# Patient Record
Sex: Male | Born: 1950 | Race: White | Hispanic: No | Smoking: Former smoker
Health system: Southern US, Community
[De-identification: ages and names within clinical notes are randomized; demographics above are authoritative.]

## PROBLEM LIST (undated history)

## (undated) DIAGNOSIS — D179 Benign lipomatous neoplasm, unspecified: Secondary | ICD-10-CM

## (undated) DIAGNOSIS — J189 Pneumonia, unspecified organism: Secondary | ICD-10-CM

## (undated) DIAGNOSIS — E669 Obesity, unspecified: Secondary | ICD-10-CM

## (undated) DIAGNOSIS — K219 Gastro-esophageal reflux disease without esophagitis: Secondary | ICD-10-CM

## (undated) DIAGNOSIS — E7800 Pure hypercholesterolemia, unspecified: Secondary | ICD-10-CM

## (undated) DIAGNOSIS — Z9889 Other specified postprocedural states: Secondary | ICD-10-CM

## (undated) DIAGNOSIS — R112 Nausea with vomiting, unspecified: Secondary | ICD-10-CM

## (undated) DIAGNOSIS — E559 Vitamin D deficiency, unspecified: Secondary | ICD-10-CM

## (undated) DIAGNOSIS — J449 Chronic obstructive pulmonary disease, unspecified: Secondary | ICD-10-CM

## (undated) DIAGNOSIS — E119 Type 2 diabetes mellitus without complications: Secondary | ICD-10-CM

## (undated) DIAGNOSIS — B1920 Unspecified viral hepatitis C without hepatic coma: Secondary | ICD-10-CM

## (undated) DIAGNOSIS — G4700 Insomnia, unspecified: Secondary | ICD-10-CM

## (undated) DIAGNOSIS — G709 Myoneural disorder, unspecified: Secondary | ICD-10-CM

## (undated) DIAGNOSIS — B182 Chronic viral hepatitis C: Secondary | ICD-10-CM

## (undated) DIAGNOSIS — G4730 Sleep apnea, unspecified: Secondary | ICD-10-CM

## (undated) DIAGNOSIS — M1990 Unspecified osteoarthritis, unspecified site: Secondary | ICD-10-CM

## (undated) HISTORY — DX: Chronic viral hepatitis C: B18.2

## (undated) HISTORY — DX: Pure hypercholesterolemia, unspecified: E78.00

## (undated) HISTORY — DX: Type 2 diabetes mellitus without complications: E11.9

## (undated) HISTORY — DX: Vitamin D deficiency, unspecified: E55.9

## (undated) HISTORY — DX: Gastro-esophageal reflux disease without esophagitis: K21.9

## (undated) HISTORY — DX: Benign lipomatous neoplasm, unspecified: D17.9

## (undated) HISTORY — DX: Insomnia, unspecified: G47.00

## (undated) HISTORY — PX: SHOULDER SURGERY: SHX246

## (undated) HISTORY — PX: EYE SURGERY: SHX253

## (undated) HISTORY — DX: Chronic obstructive pulmonary disease, unspecified: J44.9

## (undated) HISTORY — DX: Obesity, unspecified: E66.9

---
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
## 1987-10-24 HISTORY — PX: BACK SURGERY: SHX140

## 2001-08-14 ENCOUNTER — Ambulatory Visit (HOSPITAL_COMMUNITY): Admission: RE | Admit: 2001-08-14 | Discharge: 2001-08-14 | Payer: Self-pay | Admitting: Gastroenterology

## 2001-08-14 ENCOUNTER — Encounter (INDEPENDENT_AMBULATORY_CARE_PROVIDER_SITE_OTHER): Payer: Self-pay

## 2003-08-01 ENCOUNTER — Ambulatory Visit (HOSPITAL_BASED_OUTPATIENT_CLINIC_OR_DEPARTMENT_OTHER): Admission: RE | Admit: 2003-08-01 | Discharge: 2003-08-01 | Payer: Self-pay | Admitting: Internal Medicine

## 2004-10-28 ENCOUNTER — Emergency Department (HOSPITAL_COMMUNITY): Admission: EM | Admit: 2004-10-28 | Discharge: 2004-10-28 | Payer: Self-pay | Admitting: *Deleted

## 2004-10-28 ENCOUNTER — Ambulatory Visit: Payer: Self-pay | Admitting: Family Medicine

## 2004-12-26 ENCOUNTER — Ambulatory Visit: Payer: Self-pay | Admitting: Internal Medicine

## 2005-01-02 ENCOUNTER — Ambulatory Visit: Payer: Self-pay | Admitting: Internal Medicine

## 2005-04-28 ENCOUNTER — Ambulatory Visit: Payer: Self-pay | Admitting: Internal Medicine

## 2005-07-03 ENCOUNTER — Ambulatory Visit: Payer: Self-pay | Admitting: Internal Medicine

## 2005-09-27 ENCOUNTER — Ambulatory Visit (HOSPITAL_COMMUNITY): Admission: RE | Admit: 2005-09-27 | Discharge: 2005-09-27 | Payer: Self-pay | Admitting: Otolaryngology

## 2005-09-27 ENCOUNTER — Encounter (INDEPENDENT_AMBULATORY_CARE_PROVIDER_SITE_OTHER): Payer: Self-pay | Admitting: *Deleted

## 2006-01-01 ENCOUNTER — Ambulatory Visit: Payer: Self-pay | Admitting: Internal Medicine

## 2006-07-09 ENCOUNTER — Ambulatory Visit: Payer: Self-pay | Admitting: Internal Medicine

## 2006-10-08 ENCOUNTER — Ambulatory Visit: Payer: Self-pay | Admitting: Internal Medicine

## 2006-12-06 ENCOUNTER — Encounter: Admission: RE | Admit: 2006-12-06 | Discharge: 2006-12-06 | Payer: Self-pay | Admitting: Family Medicine

## 2006-12-18 ENCOUNTER — Ambulatory Visit (HOSPITAL_COMMUNITY): Admission: RE | Admit: 2006-12-18 | Discharge: 2006-12-18 | Payer: Self-pay | Admitting: Family Medicine

## 2007-03-15 ENCOUNTER — Encounter: Admission: RE | Admit: 2007-03-15 | Discharge: 2007-03-15 | Payer: Self-pay | Admitting: Family Medicine

## 2011-03-10 NOTE — Op Note (Signed)
NAMECHATHAM, HOWINGTON                ACCOUNT NO.:  [id number]   MEDICAL RECORD NO.:  [id number]          PATIENT TYPE:  AMB   LOCATION:  SDS                          FACILITY:  [facility]   PHYSICIAN:  Jefry H. Pollyann Kennedy, MD     DATE OF BIRTH:  23-Aug-1951   DATE OF PROCEDURE:  09/27/2005  DATE OF DISCHARGE:                                 OPERATIVE REPORT   PREOPERATIVE DIAGNOSIS:  Left vocal cord mass with hoarseness.   POSTOPERATIVE DIAGNOSIS:  Left vocal cord mass with hoarseness.   PROCEDURE:  Microlaryngoscopy with laser excision of left vocal cord mass.   SURGEON:  Jefry H. Pollyann Kennedy, M.D.   ANESTHESIA:  General endotracheal anesthesia.   COMPLICATIONS:  Minimal.   FINDINGS:  Thickened and hypertrophic mass involving the left membranous  vocal fold from just below the contacting surface to up in the lateral  aspect of the floor of the ventricle starting just anterior to the focal  process and ending just posterior to the anterior commissure.  The right  side was unremarkable except for some edema related to trauma from  contacting the left-sided lesion.   REFERRING PHYSICIAN:  Gordy Savers, M.D.   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   HISTORY:  This is a 60 year old gentleman with a multi-month history of  hoarseness.  He was found on office examination to have a mass involving the  left vocal cord.  Risks, benefits, alternatives and complications of the  procedure were explained to the patient who seemed to understand and agreed  to surgery.   DESCRIPTION OF PROCEDURE:  Patient was taken to the operating room and  placed on the operating table in supine position.  Following induction of  general endotracheal anesthesia, table was turned and the patient was draped  in standard fashion.  A Jaco laryngoscope was used view the larynx.  It was  attached to the Mayo stand with suspension apparatus.  The  microscope was  brought into view.  The lesion was inspected thoroughly.  Xylocaine with  epinephrine was infiltrated with laryngeal syringe into the left membranous  field.  The CO2 laser was attached to the microscope and used with a focused  point at 2 watts continuous power and this was used to dissect off the  posterior approximately one third of the lesion with a 1 to 2 mm cuff of  normal mucosa surrounding it posteriorly.  This was sent for frozen section  evaluation.  The remainder of the lesion was completely ablated using the  CO2 laser at 2 and 3 watts superpulse mode.  The focal ligament was kept  intact.  The lesion was removed entirely.  Topical  adrenalin was used to provide completion of hemostasis.  The laser  endotracheal tube was replaced with a standard #8 cuffed endotracheal tube.  Patient was then awakened, extubated and transferred to the recovery room in  stable condition.      Jefry H. Pollyann Kennedy, MD  Electronically Signed     JHR/MEDQ  D:  09/27/2005  T:  09/27/2005  Job:  [id number]   cc:   Gordy Savers, M.D. [facility]  [address] [place]  [place] [address]

## 2014-08-03 ENCOUNTER — Other Ambulatory Visit: Payer: Self-pay | Admitting: Gastroenterology

## 2014-08-03 DIAGNOSIS — C220 Liver cell carcinoma: Secondary | ICD-10-CM

## 2014-08-10 ENCOUNTER — Ambulatory Visit
Admission: RE | Admit: 2014-08-10 | Discharge: 2014-08-10 | Disposition: A | Discharge status: Home / Self Care | Payer: Medicare Other | Source: Ambulatory Visit | Attending: Gastroenterology | Admitting: Gastroenterology

## 2014-08-10 DIAGNOSIS — C220 Liver cell carcinoma: Secondary | ICD-10-CM

## 2014-09-21 ENCOUNTER — Encounter: Payer: Self-pay | Admitting: Pulmonary Disease

## 2014-09-22 ENCOUNTER — Encounter: Payer: Self-pay | Admitting: Pulmonary Disease

## 2014-09-22 ENCOUNTER — Ambulatory Visit (INDEPENDENT_AMBULATORY_CARE_PROVIDER_SITE_OTHER): Payer: Medicare Other | Admitting: Pulmonary Disease

## 2014-09-22 VITALS — BP 118/64 | HR 91 | Ht 70.0 in | Wt 310.0 lb

## 2014-09-22 DIAGNOSIS — J441 Chronic obstructive pulmonary disease with (acute) exacerbation: Secondary | ICD-10-CM

## 2014-09-22 DIAGNOSIS — R938 Abnormal findings on diagnostic imaging of other specified body structures: Secondary | ICD-10-CM

## 2014-09-22 DIAGNOSIS — R0600 Dyspnea, unspecified: Secondary | ICD-10-CM | POA: Insufficient documentation

## 2014-09-22 DIAGNOSIS — J449 Chronic obstructive pulmonary disease, unspecified: Secondary | ICD-10-CM

## 2014-09-22 DIAGNOSIS — R9389 Abnormal findings on diagnostic imaging of other specified body structures: Secondary | ICD-10-CM

## 2014-09-22 NOTE — Assessment & Plan Note (Signed)
COPD: GOLD Grade B, Grade 2 airflow obstruction Combined recommendations from the [place], [employer], Investment banker, corporate, [place] (Qaseem A et al, Ann Intern Med. 2011;155(3):179) recommends tobacco cessation, pulmonary rehab (for symptomatic patients with an FEV1 < 50% predicted), supplemental oxygen (for patients with SaO2 <88% or paO2 <55), and appropriate bronchodilator therapy.  In regards to long acting bronchodilators, they recommend monotherapy (FEV1 60-80% with symptoms weak evidence, FEV1 with symptoms <60% strong evidence), or combination therapy (FEV1 <60% with symptoms, strong recommendation, moderate evidence).  One should also provide patients with annual immunizations and consider therapy for prevention of COPD exacerbations (ie. roflumilast or azithromycin) when appopriate.  -O2 therapy: Not indicated -Immunizations: He will check with his PCP about the status of his Prevnar vaccination -Tobacco use: Quit age 74 -Exercise: Encouraged regular exercise after stress test -Bronchodilator therapy: Continue duoneb 1-2 times per day, albuterol as needed; at this point since he is doing well I don't think he needs Spiriva, but if he has worsening symptoms we could add this -Exacerbation prevention: Only one recent exacerbation, so will observe for now, may need Spiriva as detailed above if recurrent exacerbation or worsening symptoms.

## 2014-09-22 NOTE — Assessment & Plan Note (Signed)
This symptom is primarily related to COPD as it has dramatically improved with the addition of bronchodilators. However, considering his obesity and lengthy smoking history and family history of coronary artery disease I completely agree with his primary care physician's plan to obtain a cardiac stress test. If that is normal then I have advised that he start a regular exercise routine for weight loss purposes. We will try to track down the result of the stress test once it has been performed.

## 2014-09-22 NOTE — Progress Notes (Signed)
Subjective:    Patient ID: Thomas Wells, male    DOB: 1951-07-12, 63 y.o.   MRN: [id number]  HPI   Mr. Murdoch was referrd by his PCP Dr. Lisbeth Ply for a new diagnosis of COPD.  He had been recently hospitalized for shortness of breath and was diagnosed with COPD and discharged home on prednisone and albuterol.  He went to the hospital for leg swelling and pain and ended up in the hospital for COPD.  He had a CT scan which ruled out PE and had a negative lower extremity doppler ultrasound.  There apparently the noted emphysema on a CT scan and he was admitted to the hospital.  He had been coughing a lot in the mornings before he got up and he had been noting some tightness.  He said that it felt like his chest was glued to his back and he could not take a deep breath.  In the hospital he was treated with breathing treatments and this really helped his breathing.  He has been using the duoneb twice a day which he said really helped with his breathing.  He used the medicine regularly for 2 weeks and felt so much better that he started cutting back to once a day in the last few weeks.  He says that he has nearly completely stopped coughing since using the nebulizer.  He has not been producing mucus anymore either.  He previously produced "cloudy white to brown". He previously smoked 2.5 ppd for 35 years.  He quit smoking at age 89.  His father died from a heart attack and was a smoker.  He quit smoking cold [place] with Zyban at age 84.  He says that he first started noticing shortness of breath about 2009.  Since then he did not require hospitalization aside from the recent episode.  He has had multiple rounds of pneumonia and bronchitis.  He has a rescue inhaler which he uses less than once per week.  Past Medical History  Diagnosis Date  . Lipoma   . Vitamin D deficiency   . COPD (chronic obstructive pulmonary disease)   . DM2 (diabetes mellitus, type 2)   . Obesity   . GERD (gastroesophageal reflux  disease)   . Pure hypercholesterolemia   . Chronic hepatitis C   . Insomnia      Family History  Problem Relation Age of Onset  . Brain cancer Mother   . Heart disease Father      History   Social History  . Marital Status: Married    Spouse Name: N/A    Number of Children: N/A  . Years of Education: N/A   Occupational History  . Not on file.   Social History Main Topics  . Smoking status: Former Smoker -- 2.50 packs/day for 30 years    Types: Cigarettes    Quit date: 10/24/1995  . Smokeless tobacco: Never Used  . Alcohol Use: Not on file  . Drug Use: Yes     Comment: Smoked marijuana daily for 10 years.   . Sexual Activity: Not on file   Other Topics Concern  . Not on file   Social History Narrative     Allergies  Allergen Reactions  . Pravastatin Nausea Only     Outpatient Prescriptions Prior to Visit  Medication Sig Dispense Refill  . albuterol (VENTOLIN HFA) 108 (90 BASE) MCG/ACT inhaler Inhale 2 puffs into the lungs every 6 (six) hours as needed for wheezing or  shortness of breath.    . betamethasone dipropionate (DIPROLENE) 0.05 % cream Apply 1 application topically 2 (two) times daily.    . carvedilol (COREG) 6.25 MG tablet Take 6.25 mg by mouth 2 (two) times daily with a meal.    . furosemide (LASIX) 40 MG tablet Take 40 mg by mouth 2 (two) times daily.    Marland Kitchen lisinopril-hydrochlorothiazide (PRINZIDE,ZESTORETIC) 20-25 MG per tablet Take 1 tablet by mouth daily.    . metFORMIN (GLUCOPHAGE) 500 MG tablet Take 500 mg by mouth 2 (two) times daily with a meal.    . Multiple Vitamin (MULTIVITAMIN) capsule Take 1 capsule by mouth daily.    . Omega-3 Fatty Acids (FISH OIL) 1200 MG CAPS Take 2 capsules by mouth daily.    Marland Kitchen omeprazole (PRILOSEC) 20 MG capsule Take 20 mg by mouth daily.    . Probiotic Product (PROBIOTIC DAILY PO) Take 1 capsule by mouth daily.     No facility-administered medications prior to visit.      Review of Systems  Constitutional:  Negative for fever and unexpected weight change.  HENT: Negative for congestion, dental problem, ear pain, nosebleeds, postnasal drip, rhinorrhea, sinus pressure, sneezing, sore throat and trouble swallowing.   Eyes: Negative for redness and itching.  Respiratory: Positive for cough and shortness of breath. Negative for chest tightness and wheezing.   Cardiovascular: Positive for leg swelling. Negative for palpitations.  Gastrointestinal: Negative for nausea and vomiting.  Genitourinary: Negative for dysuria.  Musculoskeletal: Negative for joint swelling.  Skin: Negative for rash.  Neurological: Negative for headaches.  Hematological: Does not bruise/bleed easily.  Psychiatric/Behavioral: Negative for dysphoric mood. The patient is not nervous/anxious.        Objective:   Physical Exam Filed Vitals:   09/22/14 1635  BP: 118/64  Pulse: 91  Height: 5\' 10"  (1.778 m)  Weight: 310 lb (140.615 kg)  SpO2: 96%  RA Gen: obese, no acute distress HEENT: NCAT, PERRL, EOMi, OP clear, neck supple without masses PULM: CTA B CV: RRR, no mgr, no JVD AB: BS+, soft, nontender, no hsm Ext: warm, chronic non-pitting leg edema, no clubbing, no cyanosis Derm: chronic venous stasis changes in legs, but otherwise no rash or skin breakdown Neuro: A&Ox4, CN II-XII intact, strength 5/5 in all 4 extremities  November 2015 CT chest> no pulmonary embolism, there is mild centrilobular emphysema bilaterally, there is borderline mediastinal lymphadenopathy. Primary care physicians most recent office note reviewed     Assessment & Plan:   COPD, moderate COPD: GOLD Grade B, Grade 2 airflow obstruction Combined recommendations from the [place], [employer], Investment banker, corporate, [employer] (Qaseem A et al, Ann Intern Med. 2011;155(3):179) recommends tobacco cessation, pulmonary rehab (for symptomatic patients with an FEV1 < 50% predicted),  supplemental oxygen (for patients with SaO2 <88% or paO2 <55), and appropriate bronchodilator therapy.  In regards to long acting bronchodilators, they recommend monotherapy (FEV1 60-80% with symptoms weak evidence, FEV1 with symptoms <60% strong evidence), or combination therapy (FEV1 <60% with symptoms, strong recommendation, moderate evidence).  One should also provide patients with annual immunizations and consider therapy for prevention of COPD exacerbations (ie. roflumilast or azithromycin) when appopriate.  -O2 therapy: Not indicated -Immunizations: He will check with his PCP about the status of his Prevnar vaccination -Tobacco use: Quit age 18 -Exercise: Encouraged regular exercise after stress test -Bronchodilator therapy: Continue duoneb 1-2 times per day, albuterol as needed; at this point since he is doing well  I don't think he needs Spiriva, but if he has worsening symptoms we could add this -Exacerbation prevention: Only one recent exacerbation, so will observe for now, may need Spiriva as detailed above if recurrent exacerbation or worsening symptoms.    Dyspnea This symptom is primarily related to COPD as it has dramatically improved with the addition of bronchodilators. However, considering his obesity and lengthy smoking history and family history of coronary artery disease I completely agree with his primary care physician's plan to obtain a cardiac stress test. If that is normal then I have advised that he start a regular exercise routine for weight loss purposes. We will try to track down the result of the stress test once it has been performed.  Abnormal CT scan, chest The recent CT scan of his chest did show some borderline enlarged lymph nodes in the mediastinum and hilum. I have reviewed these images personally and feel that these are likely reactive as the lymph nodes themselves are actually not that large. I agree with the plan for their to be a repeat CT scan in 6 months  to ensure resolution.    Updated Medication List Outpatient Encounter Prescriptions as of 09/22/2014  Medication Sig  . albuterol (VENTOLIN HFA) 108 (90 BASE) MCG/ACT inhaler Inhale 2 puffs into the lungs every 6 (six) hours as needed for wheezing or shortness of breath.  . betamethasone dipropionate (DIPROLENE) 0.05 % cream Apply 1 application topically 2 (two) times daily.  . carvedilol (COREG) 6.25 MG tablet Take 6.25 mg by mouth 2 (two) times daily with a meal.  . furosemide (LASIX) 40 MG tablet Take 40 mg by mouth 2 (two) times daily.  Marland Kitchen lisinopril-hydrochlorothiazide (PRINZIDE,ZESTORETIC) 20-25 MG per tablet Take 1 tablet by mouth daily.  . metFORMIN (GLUCOPHAGE) 500 MG tablet Take 500 mg by mouth 2 (two) times daily with a meal.  . Multiple Vitamin (MULTIVITAMIN) capsule Take 1 capsule by mouth daily.  . Omega-3 Fatty Acids (FISH OIL) 1200 MG CAPS Take 2 capsules by mouth daily.  Marland Kitchen omeprazole (PRILOSEC) 20 MG capsule Take 20 mg by mouth daily.  . Probiotic Product (PROBIOTIC DAILY PO) Take 1 capsule by mouth daily.

## 2014-09-22 NOTE — Patient Instructions (Signed)
For now, continue taking your nebulizer medication 1-2 times per day and your inhaler as needed We will give you the Prevnar vaccine if your primary care doctor has not done this, we will request your immunization record We will also follow up the results of the cardiac stress test Once the stress test has been performed, start walking for exercise every day.  Start with 5-10 minutes at a time and work up to 30 minutes a day We will see you back in 6-8 weeks or sooner if needed

## 2014-09-22 NOTE — Assessment & Plan Note (Signed)
The recent CT scan of his chest did show some borderline enlarged lymph nodes in the mediastinum and hilum. I have reviewed these images personally and feel that these are likely reactive as the lymph nodes themselves are actually not that large. I agree with the plan for their to be a repeat CT scan in 6 months to ensure resolution.

## 2014-10-15 ENCOUNTER — Emergency Department (HOSPITAL_COMMUNITY): Payer: No Typology Code available for payment source

## 2014-10-15 ENCOUNTER — Observation Stay (HOSPITAL_COMMUNITY)
Admission: EM | Admit: 2014-10-15 | Discharge: 2014-10-17 | Disposition: A | Discharge status: Home / Self Care | Payer: No Typology Code available for payment source | Attending: General Surgery | Admitting: General Surgery

## 2014-10-15 ENCOUNTER — Encounter (HOSPITAL_COMMUNITY): Payer: Self-pay | Admitting: Emergency Medicine

## 2014-10-15 DIAGNOSIS — Z87891 Personal history of nicotine dependence: Secondary | ICD-10-CM | POA: Diagnosis not present

## 2014-10-15 DIAGNOSIS — Y92410 Unspecified street and highway as the place of occurrence of the external cause: Secondary | ICD-10-CM | POA: Diagnosis not present

## 2014-10-15 DIAGNOSIS — S2239XA Fracture of one rib, unspecified side, initial encounter for closed fracture: Secondary | ICD-10-CM | POA: Diagnosis present

## 2014-10-15 DIAGNOSIS — E780 Pure hypercholesterolemia: Secondary | ICD-10-CM | POA: Diagnosis not present

## 2014-10-15 DIAGNOSIS — M79645 Pain in left finger(s): Secondary | ICD-10-CM

## 2014-10-15 DIAGNOSIS — E876 Hypokalemia: Secondary | ICD-10-CM | POA: Diagnosis not present

## 2014-10-15 DIAGNOSIS — E559 Vitamin D deficiency, unspecified: Secondary | ICD-10-CM | POA: Diagnosis not present

## 2014-10-15 DIAGNOSIS — Z888 Allergy status to other drugs, medicaments and biological substances status: Secondary | ICD-10-CM | POA: Diagnosis not present

## 2014-10-15 DIAGNOSIS — G4700 Insomnia, unspecified: Secondary | ICD-10-CM | POA: Diagnosis not present

## 2014-10-15 DIAGNOSIS — B182 Chronic viral hepatitis C: Secondary | ICD-10-CM | POA: Insufficient documentation

## 2014-10-15 DIAGNOSIS — S0001XA Abrasion of scalp, initial encounter: Secondary | ICD-10-CM | POA: Insufficient documentation

## 2014-10-15 DIAGNOSIS — Z79899 Other long term (current) drug therapy: Secondary | ICD-10-CM | POA: Diagnosis not present

## 2014-10-15 DIAGNOSIS — E119 Type 2 diabetes mellitus without complications: Secondary | ICD-10-CM | POA: Insufficient documentation

## 2014-10-15 DIAGNOSIS — K219 Gastro-esophageal reflux disease without esophagitis: Secondary | ICD-10-CM | POA: Insufficient documentation

## 2014-10-15 DIAGNOSIS — S2243XA Multiple fractures of ribs, bilateral, initial encounter for closed fracture: Secondary | ICD-10-CM | POA: Diagnosis not present

## 2014-10-15 DIAGNOSIS — R079 Chest pain, unspecified: Secondary | ICD-10-CM | POA: Diagnosis present

## 2014-10-15 DIAGNOSIS — J441 Chronic obstructive pulmonary disease with (acute) exacerbation: Secondary | ICD-10-CM | POA: Diagnosis not present

## 2014-10-15 DIAGNOSIS — S2249XA Multiple fractures of ribs, unspecified side, initial encounter for closed fracture: Secondary | ICD-10-CM | POA: Diagnosis present

## 2014-10-15 LAB — COMPREHENSIVE METABOLIC PANEL
ALK PHOS: 68 U/L (ref 39–117)
ALT: 54 U/L — AB (ref 0–53)
AST: 93 U/L — ABNORMAL HIGH (ref 0–37)
Albumin: 3.1 g/dL — ABNORMAL LOW (ref 3.5–5.2)
Anion gap: 7 (ref 5–15)
BILIRUBIN TOTAL: 1.3 mg/dL — AB (ref 0.3–1.2)
BUN: 10 mg/dL (ref 6–23)
CHLORIDE: 104 meq/L (ref 96–112)
CO2: 29 mmol/L (ref 19–32)
Calcium: 8.9 mg/dL (ref 8.4–10.5)
Creatinine, Ser: 0.86 mg/dL (ref 0.50–1.35)
GLUCOSE: 181 mg/dL — AB (ref 70–99)
POTASSIUM: 3.3 mmol/L — AB (ref 3.5–5.1)
SODIUM: 140 mmol/L (ref 135–145)
Total Protein: 6.6 g/dL (ref 6.0–8.3)

## 2014-10-15 LAB — CBC WITH DIFFERENTIAL/PLATELET
Basophils Absolute: 0 10*3/uL (ref 0.0–0.1)
Basophils Relative: 1 % (ref 0–1)
EOS PCT: 3 % (ref 0–5)
Eosinophils Absolute: 0.2 10*3/uL (ref 0.0–0.7)
HEMATOCRIT: 39.8 % (ref 39.0–52.0)
HEMOGLOBIN: 13.3 g/dL (ref 13.0–17.0)
LYMPHS ABS: 1.5 10*3/uL (ref 0.7–4.0)
LYMPHS PCT: 19 % (ref 12–46)
MCH: 33.9 pg (ref 26.0–34.0)
MCHC: 33.4 g/dL (ref 30.0–36.0)
MCV: 101.5 fL — AB (ref 78.0–100.0)
MONOS PCT: 8 % (ref 3–12)
Monocytes Absolute: 0.6 10*3/uL (ref 0.1–1.0)
Neutro Abs: 5.3 10*3/uL (ref 1.7–7.7)
Neutrophils Relative %: 69 % (ref 43–77)
PLATELETS: 161 10*3/uL (ref 150–400)
RBC: 3.92 MIL/uL — AB (ref 4.22–5.81)
RDW: 12.7 % (ref 11.5–15.5)
WBC: 7.6 10*3/uL (ref 4.0–10.5)

## 2014-10-15 LAB — PROTIME-INR
INR: 1.15 (ref 0.00–1.49)
Prothrombin Time: 14.8 seconds (ref 11.6–15.2)

## 2014-10-15 LAB — TROPONIN I: Troponin I: <0.03 ng/mL (ref <0.031)

## 2014-10-15 LAB — SAMPLE TO BLOOD BANK

## 2014-10-15 LAB — ETHANOL: Alcohol, Ethyl (B): <5 mg/dL (ref 0–9)

## 2014-10-15 LAB — CBG MONITORING, ED: Glucose-Capillary: 113 mg/dL — ABNORMAL HIGH (ref 70–99)

## 2014-10-15 LAB — LACTIC ACID, PLASMA: Lactic Acid, Venous: 2 mmol/L (ref 0.5–2.2)

## 2014-10-15 LAB — CDS SEROLOGY

## 2014-10-15 MED ORDER — DOCUSATE SODIUM 100 MG PO CAPS
100.0000 mg | ORAL_CAPSULE | Freq: Two times a day (BID) | ORAL | Status: DC
  Administered 2014-10-15 – 2014-10-17 (×4): 100 mg via ORAL
  Filled 2014-10-15 (×5): qty 1

## 2014-10-15 MED ORDER — FENTANYL CITRATE 0.05 MG/ML IJ SOLN
50.0000 ug | Freq: Once | INTRAMUSCULAR | Status: AC
  Administered 2014-10-15: 50 ug via INTRAVENOUS
  Filled 2014-10-15: qty 2

## 2014-10-15 MED ORDER — FUROSEMIDE 40 MG PO TABS
40.0000 mg | ORAL_TABLET | Freq: Two times a day (BID) | ORAL | Status: DC
  Administered 2014-10-16 – 2014-10-17 (×3): 40 mg via ORAL
  Filled 2014-10-15 (×5): qty 1

## 2014-10-15 MED ORDER — ALBUTEROL SULFATE HFA 108 (90 BASE) MCG/ACT IN AERS: 2.0000 | INHALATION_SPRAY | Freq: Four times a day (QID) | RESPIRATORY_TRACT | Status: DC | PRN

## 2014-10-15 MED ORDER — ALBUTEROL SULFATE (2.5 MG/3ML) 0.083% IN NEBU: 2.5000 mg | INHALATION_SOLUTION | Freq: Four times a day (QID) | RESPIRATORY_TRACT | Status: DC | PRN

## 2014-10-15 MED ORDER — METHOCARBAMOL 750 MG PO TABS
750.0000 mg | ORAL_TABLET | Freq: Three times a day (TID) | ORAL | Status: DC
  Administered 2014-10-15 – 2014-10-17 (×5): 750 mg via ORAL
  Filled 2014-10-15 (×9): qty 1

## 2014-10-15 MED ORDER — PANTOPRAZOLE SODIUM 40 MG PO TBEC
40.0000 mg | DELAYED_RELEASE_TABLET | Freq: Every day | ORAL | Status: DC
  Administered 2014-10-16 – 2014-10-17 (×2): 40 mg via ORAL
  Filled 2014-10-15 (×2): qty 1

## 2014-10-15 MED ORDER — ENOXAPARIN SODIUM 40 MG/0.4ML ~~LOC~~ SOLN
40.0000 mg | SUBCUTANEOUS | Status: DC
  Filled 2014-10-15: qty 0.4

## 2014-10-15 MED ORDER — MORPHINE SULFATE 4 MG/ML IJ SOLN
4.0000 mg | Freq: Once | INTRAMUSCULAR | Status: AC
Start: 2014-10-15 — End: 2014-10-15
  Administered 2014-10-15: 4 mg via INTRAVENOUS
  Filled 2014-10-15: qty 1

## 2014-10-15 MED ORDER — ACETAMINOPHEN 325 MG PO TABS: 650.0000 mg | ORAL_TABLET | ORAL | Status: DC | PRN

## 2014-10-15 MED ORDER — IOHEXOL 300 MG/ML  SOLN
100.0000 mL | Freq: Once | INTRAMUSCULAR | Status: AC | PRN
  Administered 2014-10-15: 100 mL via INTRAVENOUS

## 2014-10-15 MED ORDER — ONDANSETRON HCL 4 MG/2ML IJ SOLN: 4.0000 mg | Freq: Four times a day (QID) | INTRAMUSCULAR | Status: DC | PRN

## 2014-10-15 MED ORDER — OXYCODONE HCL 5 MG PO TABS
5.0000 mg | ORAL_TABLET | ORAL | Status: DC | PRN
  Administered 2014-10-16 – 2014-10-17 (×2): 5 mg via ORAL
  Filled 2014-10-15 (×2): qty 1

## 2014-10-15 MED ORDER — HYDROMORPHONE HCL 1 MG/ML IJ SOLN: 1.0000 mg | INTRAMUSCULAR | Status: DC | PRN

## 2014-10-15 MED ORDER — POTASSIUM CHLORIDE IN NACL 20-0.9 MEQ/L-% IV SOLN
INTRAVENOUS | Status: DC
  Administered 2014-10-15: 23:00:00 via INTRAVENOUS
  Filled 2014-10-15 (×3): qty 1000

## 2014-10-15 MED ORDER — ONDANSETRON HCL 4 MG PO TABS: 4.0000 mg | ORAL_TABLET | Freq: Four times a day (QID) | ORAL | Status: DC | PRN

## 2014-10-15 MED ORDER — SODIUM CHLORIDE 0.9 % IV BOLUS (SEPSIS)
500.0000 mL | Freq: Once | INTRAVENOUS | Status: AC
  Administered 2014-10-15: 500 mL via INTRAVENOUS

## 2014-10-15 MED ORDER — CARVEDILOL 6.25 MG PO TABS
6.2500 mg | ORAL_TABLET | Freq: Two times a day (BID) | ORAL | Status: DC
  Administered 2014-10-16 – 2014-10-17 (×3): 6.25 mg via ORAL
  Filled 2014-10-15 (×5): qty 1

## 2014-10-15 MED ORDER — ENOXAPARIN SODIUM 40 MG/0.4ML ~~LOC~~ SOLN
40.0000 mg | Freq: Every day | SUBCUTANEOUS | Status: DC
  Administered 2014-10-15 – 2014-10-16 (×2): 40 mg via SUBCUTANEOUS
  Filled 2014-10-15 (×2): qty 0.4

## 2014-10-15 MED ORDER — INSULIN ASPART 100 UNIT/ML ~~LOC~~ SOLN
0.0000 [IU] | Freq: Three times a day (TID) | SUBCUTANEOUS | Status: DC
  Administered 2014-10-16 – 2014-10-17 (×2): 3 [IU] via SUBCUTANEOUS

## 2014-10-15 MED ORDER — MORPHINE SULFATE 2 MG/ML IJ SOLN: 1.0000 mg | INTRAMUSCULAR | Status: DC | PRN

## 2014-10-15 MED ORDER — POTASSIUM CHLORIDE CRYS ER 20 MEQ PO TBCR
40.0000 meq | EXTENDED_RELEASE_TABLET | Freq: Once | ORAL | Status: AC
  Administered 2014-10-15: 40 meq via ORAL
  Filled 2014-10-15: qty 2

## 2014-10-15 MED ORDER — OXYCODONE HCL 5 MG PO TABS
10.0000 mg | ORAL_TABLET | ORAL | Status: DC | PRN
  Administered 2014-10-16 – 2014-10-17 (×3): 10 mg via ORAL
  Filled 2014-10-15 (×4): qty 2

## 2014-10-15 MED ORDER — HYDROCHLOROTHIAZIDE 25 MG PO TABS
25.0000 mg | ORAL_TABLET | Freq: Every day | ORAL | Status: DC
  Administered 2014-10-16 – 2014-10-17 (×2): 25 mg via ORAL
  Filled 2014-10-15 (×2): qty 1

## 2014-10-15 MED ORDER — LISINOPRIL 20 MG PO TABS
20.0000 mg | ORAL_TABLET | Freq: Every day | ORAL | Status: DC
  Administered 2014-10-16 – 2014-10-17 (×2): 20 mg via ORAL
  Filled 2014-10-15 (×2): qty 1

## 2014-10-15 MED ORDER — LISINOPRIL-HYDROCHLOROTHIAZIDE 20-25 MG PO TABS: 1.0000 | ORAL_TABLET | Freq: Every day | ORAL | Status: DC

## 2014-10-15 NOTE — ED Notes (Signed)
Trauma MD at bedside.

## 2014-10-15 NOTE — ED Notes (Signed)
Resident informed of lab delay. Lab states both machines are "down" and the cmp should result in the next 15 min
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
## 2014-10-15 NOTE — ED Notes (Signed)
Patient O2 sats noted to be 87% on RA. O2 via [place] applied @ 2L
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
## 2014-10-15 NOTE — ED Notes (Signed)
Patient transported to CT 
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
## 2014-10-16 DIAGNOSIS — S2243XA Multiple fractures of ribs, bilateral, initial encounter for closed fracture: Secondary | ICD-10-CM | POA: Diagnosis not present

## 2014-10-16 LAB — COMPREHENSIVE METABOLIC PANEL
ALBUMIN: 3 g/dL — AB (ref 3.5–5.2)
ALT: 53 U/L (ref 0–53)
AST: 89 U/L — ABNORMAL HIGH (ref 0–37)
Alkaline Phosphatase: 65 U/L (ref 39–117)
Anion gap: 4 — ABNORMAL LOW (ref 5–15)
BUN: 8 mg/dL (ref 6–23)
CO2: 30 mmol/L (ref 19–32)
Calcium: 8.5 mg/dL (ref 8.4–10.5)
Chloride: 106 mEq/L (ref 96–112)
Creatinine, Ser: 0.76 mg/dL (ref 0.50–1.35)
GFR calc Af Amer: >90 mL/min (ref >90)
GFR calc non Af Amer: >90 mL/min (ref >90)
Glucose, Bld: 154 mg/dL — ABNORMAL HIGH (ref 70–99)
POTASSIUM: 4 mmol/L (ref 3.5–5.1)
Sodium: 140 mmol/L (ref 135–145)
TOTAL PROTEIN: 6.5 g/dL (ref 6.0–8.3)
Total Bilirubin: 1.1 mg/dL (ref 0.3–1.2)

## 2014-10-16 LAB — CBC
HCT: 40.3 % (ref 39.0–52.0)
Hemoglobin: 13 g/dL (ref 13.0–17.0)
MCH: 33.4 pg (ref 26.0–34.0)
MCHC: 32.3 g/dL (ref 30.0–36.0)
MCV: 103.6 fL — AB (ref 78.0–100.0)
PLATELETS: 155 10*3/uL (ref 150–400)
RBC: 3.89 MIL/uL — AB (ref 4.22–5.81)
RDW: 13.2 % (ref 11.5–15.5)
WBC: 7.2 10*3/uL (ref 4.0–10.5)

## 2014-10-16 LAB — GLUCOSE, CAPILLARY
GLUCOSE-CAPILLARY: 128 mg/dL — AB (ref 70–99)
GLUCOSE-CAPILLARY: 119 mg/dL — AB (ref 70–99)
Glucose-Capillary: 146 mg/dL — ABNORMAL HIGH (ref 70–99)
Glucose-Capillary: 118 mg/dL — ABNORMAL HIGH (ref 70–99)

## 2014-10-16 LAB — HEMOGLOBIN A1C
HEMOGLOBIN A1C: 6.8 % — AB (ref <5.7)
Mean Plasma Glucose: 148 mg/dL — ABNORMAL HIGH (ref <117)

## 2014-10-16 NOTE — Progress Notes (Signed)
UR completed 
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
## 2014-10-17 ENCOUNTER — Encounter (INDEPENDENT_AMBULATORY_CARE_PROVIDER_SITE_OTHER): Payer: Self-pay | Admitting: Orthopedic Surgery

## 2014-10-17 ENCOUNTER — Observation Stay (HOSPITAL_COMMUNITY): Payer: No Typology Code available for payment source

## 2014-10-17 DIAGNOSIS — S2243XA Multiple fractures of ribs, bilateral, initial encounter for closed fracture: Secondary | ICD-10-CM | POA: Diagnosis not present

## 2014-10-17 LAB — GLUCOSE, CAPILLARY
GLUCOSE-CAPILLARY: 139 mg/dL — AB (ref 70–99)
Glucose-Capillary: 141 mg/dL — ABNORMAL HIGH (ref 70–99)

## 2014-10-17 MED ORDER — METHOCARBAMOL 750 MG PO TABS: 750.0000 mg | ORAL_TABLET | Freq: Three times a day (TID) | ORAL | Status: DC | PRN

## 2014-10-17 MED ORDER — OXYCODONE-ACETAMINOPHEN 5-325 MG PO TABS: 1.0000 | ORAL_TABLET | ORAL | Status: DC | PRN
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
## 2014-10-17 NOTE — Discharge Instructions (Signed)
Use thumb splint for comfort.  No driving while taking oxycodone.  Increase activity as pain allows.
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
## 2014-10-17 NOTE — Progress Notes (Signed)
Orthopedic Tech Progress Note Patient Details:  Thomas Wells 1951/09/07 [id number]  Ortho Devices Type of Ortho Device: Thumb velcro splint Ortho Device/Splint Location: lue Ortho Device/Splint Interventions: Application   [name] 10/17/2014, 3:13 PM

## 2014-11-09 ENCOUNTER — Ambulatory Visit (INDEPENDENT_AMBULATORY_CARE_PROVIDER_SITE_OTHER): Payer: Medicare Other | Admitting: Pulmonary Disease

## 2014-11-09 ENCOUNTER — Encounter: Payer: Self-pay | Admitting: Pulmonary Disease

## 2014-11-09 VITALS — BP 124/70 | HR 81 | Ht 70.0 in | Wt 302.0 lb

## 2014-11-09 DIAGNOSIS — G4733 Obstructive sleep apnea (adult) (pediatric): Secondary | ICD-10-CM

## 2014-11-09 DIAGNOSIS — J449 Chronic obstructive pulmonary disease, unspecified: Secondary | ICD-10-CM
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
## 2014-11-09 NOTE — Patient Instructions (Signed)
We will prescribe CPAP once we have the results of the sleep study We will see you back in 6 weeks or sooner if needed

## 2014-11-09 NOTE — Assessment & Plan Note (Signed)
Thomas Wells has at worst gold stage II airflow obstruction but symptoms consistent with Gold grade D COPD. I believe that his FEV1 is a bit underestimated considering his morbid obesity. He has been doing well from a respiratory standpoint on as needed albuterol alone.  Plan: -Continue as needed DuoNeb -Annual flu shot again next year

## 2014-11-10 ENCOUNTER — Telehealth: Payer: Self-pay

## 2014-11-10 DIAGNOSIS — G4733 Obstructive sleep apnea (adult) (pediatric): Secondary | ICD-10-CM

## 2014-11-10 NOTE — Telephone Encounter (Signed)
Spoke with pt, he is aware of recs.  Orders placed.  Nothing further needed.

## 2014-11-10 NOTE — Telephone Encounter (Signed)
-----   Message from Juanito Doom, MD sent at 11/09/2014  6:42 PM EST ----- Caryl Pina,  We need to start him on CPAP at 10 cm of water. He does not need an auto titrating device. He needs a 3 week download. He will need an overnight oximetry test after he has been on CPAP for about a week.  Thanks, Ruby Cola

## 2014-11-19 ENCOUNTER — Telehealth: Payer: Self-pay | Admitting: Pulmonary Disease

## 2014-11-19 NOTE — Telephone Encounter (Signed)
Per [name] w/ [facility]: Thomas Wells is aware of this situation.  We had to wait to get the sleep study which I'm picking up at your office today. We are also having to track down the office visit note prior to the sleep study from the PCP --  Called made pt aware. Nothing further needed

## 2014-11-19 NOTE — Telephone Encounter (Signed)
Per 11/10/14 order placed to [facility]: Note    Please set up patient for a cpap on 10 cm water, with heated humidity, climate control tubing, mask of choice. Please enroll patient in [place]. Please set up patient in [facility]. Please send download in 3 weeks after pt is set up on cpap. Please use dme of insurance's preference, as patient is not currently set up with a dme company. Thank you!  --  I have sent Melissa a message to check on status of order. Will await response.  Called pt and made him aware. He reports [facility] keeps telling him they have nothing from [place]. Pt reports he is going to hold Dr. [place] Bells to the date of 2/15 of being able to return back to work.  Will await response from [facility]

## 2014-11-24 ENCOUNTER — Telehealth: Payer: Self-pay | Admitting: Pulmonary Disease

## 2014-11-24 DIAGNOSIS — G4733 Obstructive sleep apnea (adult) (pediatric): Secondary | ICD-10-CM

## 2014-11-24 NOTE — Telephone Encounter (Signed)
Spoke with pt. States that he still has not received his CPAP machine. Per Mindy's documentation from 11/19/14:  Per [name] w/ [facility]: Alida is aware of this situation.  We had to wait to get the sleep study which I'm picking up at your office today. We are also having to track down the office visit note prior to the sleep study from the PCP.  I have called a left a message with Melissa to call us back x1.  Pt is aware that we are working on this for him.

## 2014-11-24 NOTE — Telephone Encounter (Signed)
Spoke with Melissa. They have what they need and pt is scheduled for Thursday to be set up.

## 2014-12-07 ENCOUNTER — Ambulatory Visit (INDEPENDENT_AMBULATORY_CARE_PROVIDER_SITE_OTHER): Payer: Medicare Other | Admitting: Pulmonary Disease

## 2014-12-07 ENCOUNTER — Ambulatory Visit: Payer: Medicare Other | Admitting: Pulmonary Disease

## 2014-12-07 ENCOUNTER — Encounter: Payer: Self-pay | Admitting: Pulmonary Disease

## 2014-12-07 VITALS — BP 142/68 | HR 87 | Ht 70.0 in | Wt 298.0 lb

## 2014-12-07 DIAGNOSIS — G4733 Obstructive sleep apnea (adult) (pediatric): Secondary | ICD-10-CM

## 2014-12-07 DIAGNOSIS — J449 Chronic obstructive pulmonary disease, unspecified: Secondary | ICD-10-CM

## 2014-12-07 MED ORDER — IPRATROPIUM-ALBUTEROL 0.5-2.5 (3) MG/3ML IN SOLN: 3.0000 mL | Freq: Four times a day (QID) | RESPIRATORY_TRACT | Status: DC | PRN

## 2014-12-07 NOTE — Assessment & Plan Note (Signed)
This has been a stable interval for Thomas Wells in terms of his COPD. As described previously, his morbid obesity complicates interpretation of his pulmonary function testing and so I believe that his airflow obstruction is actually mild rather than moderate.  Plan: -Continue DuoNeb 2-3 times a day as needed for shortness of breath

## 2014-12-07 NOTE — Patient Instructions (Signed)
Continue using her CPAP machine nightly Continue using your albuterol 2-3 times per day as needed for shortness of breath   For the sinus congestion with the CPAP machine: Use saline gel in each nostril before bedtime Try adjusting the temperature on the machine down to 76 degrees or as high as 86 degrees  For the mask causing pressure on your face: Take the mask to the DME provider who provided you the mask and ask for an alternative mask

## 2014-12-07 NOTE — Assessment & Plan Note (Signed)
I have received the download from his compliance report with the CPAP machine and it shows that his compliance is excellent. He is actually sleeping a little more than he did prior to using the CPAP machine. Considering this and his energy level is slightly improved I have explained to him that it is okay for him to drive and to return to work. He just needs to make sure that he uses his CPAP machine every night. He has been experiencing some sinus congestion which I believe is related to the heated humidity.   Plan: -I have advised that he turned down the temperature slightly and that he start using saline gel in his nostrils at night. -Continue CPAP nightly -Okay to resume driving and work as long as he uses CPAP every night -Advised to not drive while sleepy
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
## 2014-12-24 ENCOUNTER — Encounter: Payer: Self-pay | Admitting: Pulmonary Disease

## 2015-08-06 ENCOUNTER — Other Ambulatory Visit: Payer: Self-pay | Admitting: Gastroenterology

## 2015-08-06 DIAGNOSIS — R945 Abnormal results of liver function studies: Principal | ICD-10-CM

## 2015-08-06 DIAGNOSIS — R7989 Other specified abnormal findings of blood chemistry: Secondary | ICD-10-CM

## 2015-08-30 ENCOUNTER — Ambulatory Visit
Admission: RE | Admit: 2015-08-30 | Discharge: 2015-08-30 | Disposition: A | Discharge status: Home / Self Care | Payer: Medicare Other | Source: Ambulatory Visit | Attending: Gastroenterology | Admitting: Gastroenterology

## 2015-08-30 DIAGNOSIS — R7989 Other specified abnormal findings of blood chemistry: Secondary | ICD-10-CM

## 2015-08-30 DIAGNOSIS — R945 Abnormal results of liver function studies: Principal | ICD-10-CM
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
## 2016-05-19 ENCOUNTER — Other Ambulatory Visit (HOSPITAL_COMMUNITY): Payer: Self-pay | Admitting: Medical

## 2016-05-19 DIAGNOSIS — R1310 Dysphagia, unspecified: Secondary | ICD-10-CM

## 2016-05-24 ENCOUNTER — Ambulatory Visit (HOSPITAL_COMMUNITY)
Admission: RE | Admit: 2016-05-24 | Discharge: 2016-05-24 | Disposition: A | Discharge status: Home / Self Care | Payer: Medicare Other | Source: Ambulatory Visit | Attending: Medical | Admitting: Medical

## 2016-05-24 DIAGNOSIS — K219 Gastro-esophageal reflux disease without esophagitis: Secondary | ICD-10-CM | POA: Insufficient documentation

## 2016-05-24 DIAGNOSIS — R1310 Dysphagia, unspecified: Secondary | ICD-10-CM | POA: Insufficient documentation

## 2016-08-18 ENCOUNTER — Other Ambulatory Visit (HOSPITAL_COMMUNITY): Payer: Self-pay | Admitting: Gastroenterology

## 2016-08-18 DIAGNOSIS — B182 Chronic viral hepatitis C: Secondary | ICD-10-CM

## 2016-09-11 ENCOUNTER — Ambulatory Visit (HOSPITAL_COMMUNITY): Payer: Medicare Other

## 2016-09-18 ENCOUNTER — Ambulatory Visit (HOSPITAL_COMMUNITY)
Admission: RE | Admit: 2016-09-18 | Discharge: 2016-09-18 | Disposition: A | Discharge status: Home / Self Care | Payer: Medicare Other | Source: Ambulatory Visit | Attending: Gastroenterology | Admitting: Gastroenterology

## 2016-09-18 DIAGNOSIS — B182 Chronic viral hepatitis C: Secondary | ICD-10-CM | POA: Diagnosis not present

## 2016-09-18 DIAGNOSIS — K7460 Unspecified cirrhosis of liver: Secondary | ICD-10-CM | POA: Diagnosis not present

## 2016-09-18 DIAGNOSIS — K8020 Calculus of gallbladder without cholecystitis without obstruction: Secondary | ICD-10-CM | POA: Diagnosis not present

## 2016-09-25 ENCOUNTER — Ambulatory Visit (HOSPITAL_COMMUNITY): Payer: Medicare Other

## 2016-09-25 ENCOUNTER — Other Ambulatory Visit (HOSPITAL_COMMUNITY): Payer: Medicare Other

## 2016-11-24 ENCOUNTER — Ambulatory Visit (INDEPENDENT_AMBULATORY_CARE_PROVIDER_SITE_OTHER): Payer: Self-pay | Admitting: Orthopaedic Surgery

## 2016-11-24 ENCOUNTER — Ambulatory Visit (INDEPENDENT_AMBULATORY_CARE_PROVIDER_SITE_OTHER): Payer: Worker's Compensation | Admitting: Orthopaedic Surgery

## 2016-11-24 ENCOUNTER — Encounter (INDEPENDENT_AMBULATORY_CARE_PROVIDER_SITE_OTHER): Payer: Self-pay | Admitting: Orthopaedic Surgery

## 2016-11-24 VITALS — BP 134/63 | HR 75 | Ht 70.0 in | Wt 265.0 lb

## 2016-11-24 DIAGNOSIS — M75121 Complete rotator cuff tear or rupture of right shoulder, not specified as traumatic: Secondary | ICD-10-CM
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
## 2019-03-24 DIAGNOSIS — M1711 Unilateral primary osteoarthritis, right knee: Secondary | ICD-10-CM | POA: Diagnosis present

## 2019-03-24 DIAGNOSIS — R609 Edema, unspecified: Secondary | ICD-10-CM | POA: Diagnosis present

## 2019-03-24 DIAGNOSIS — E119 Type 2 diabetes mellitus without complications: Secondary | ICD-10-CM

## 2019-03-24 DIAGNOSIS — M1712 Unilateral primary osteoarthritis, left knee: Secondary | ICD-10-CM | POA: Diagnosis present

## 2019-03-24 DIAGNOSIS — K219 Gastro-esophageal reflux disease without esophagitis: Secondary | ICD-10-CM | POA: Diagnosis present

## 2019-03-24 DIAGNOSIS — I10 Essential (primary) hypertension: Secondary | ICD-10-CM | POA: Diagnosis present

## 2019-03-24 NOTE — H&P (Signed)
KNEE ARTHROPLASTY ADMISSION H&P  Patient ID: Thomas Wells MRN: [id number] DOB/AGE: August 15, 1951 68 y.o.  Chief Complaint: Left knee pain.  Planned Procedure Date: 04/15/19  Medical Clearance by Dr. Jeralene Huff    Additional clearance by Pulmonology: Dr. Rogue Jury   HPI: Thomas Wells is a 68 y.o. male with a history of COPD, HTN, DM, Edema, HLD, OSA on CPAP, and GERD who presents for evaluation of OA LEFT KNEE. The patient has a history of pain and functional disability in the left knee due to arthritis and has failed non-surgical conservative treatments for greater than 12 weeks to include NSAID's and/or analgesics, corticosteriod injections and activity modification.  Onset of symptoms was gradual, starting 5 + years ago with gradually worsening course since that time.  Patient currently rates pain at 10 out of 10 with activity. Patient has night pain, worsening of pain with activity and weight bearing and pain that interferes with activities of daily living.  Patient has evidence of subchondral cysts, subchondral sclerosis, periarticular osteophytes and joint space narrowing by imaging studies.  There is no active infection.  Past Medical History:  Diagnosis Date  . Chronic hepatitis C ([facility])   . COPD (chronic obstructive pulmonary disease) ([place])   . DM2 (diabetes mellitus, type 2) ([place])   . GERD (gastroesophageal reflux disease)   . Insomnia   . Lipoma   . Obesity   . Pure hypercholesterolemia   . Vitamin D deficiency    Past Surgical History:  Procedure Laterality Date  . BACK SURGERY  1989  . FINGER AMPUTATION  10/1968   Allergies  Allergen Reactions  . Hydrocodone Nausea And Vomiting  . Pravastatin Nausea Only    Joint pain   Medications: Omeprazole 40 mg daily. Diltiazem ER 180 mg daily. Lisinopril 20 mg daily Spironolactone 25 mg daily. Advair discus 250/50 daily Albuterol as needed  Social history: Retired former smoker - 2 to 3 packs/day for 30+ years.  Quit 1997.  No  alcohol use.  Family History  Problem Relation Age of Onset  . Brain cancer Mother   . Heart disease Father     ROS: Currently denies lightheadedness, dizziness, Fever, chills, CP, SOB.   No personal history of DVT, PE, MI, or CVA. No loose teeth.  He has dentures.  He wears glasses. All other systems have been reviewed and were otherwise currently negative with the exception of those mentioned in the HPI and as above.  Objective: Vitals: Ht: 5'11" Wt: 271 Temp: 98.2 BP: 143/88 Pulse: 63 O2 97% on room air.   Physical Exam: General: Alert, NAD.  Antalgic Gait  HEENT: EOMI, Good Neck Extension  Pulm: No increased work of breathing.  Clear B/L A/P w/o crackle or wheeze.  CV: RRR, No m/g/r appreciated  GI: soft, NT, ND Neuro: Neuro without gross focal deficit.  Sensation intact distally Skin: No lesions in the area of chief complaint MSK/Surgical Site: LEFT knee w/o redness or effusion.  + JLT. ROM 7.5-90 on the left 5-100 on the right.  5/5 strength in extension and flexion.  +EHL/FHL.  NVI.  Stable varus and valgus stress.  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   Imaging Review Plain radiographs demonstrate severe degenerative joint disease of bilateral knees.  Medial more than lateral JSN.  Preoperative templating of the joint replacement has been completed, documented, and submitted to the Operating Room personnel in order to optimize intra-operative equipment management.  Assessment: OA Left KNEE Principal Problem:   Primary  osteoarthritis of left knee Active Problems:   COPD, moderate ([facility])   Obstructive sleep apnea   DM2 (diabetes mellitus, type 2) ([facility])   HTN (hypertension)   Edema   GERD (gastroesophageal reflux disease)   Primary osteoarthritis of right knee   Plan: Plan for Procedure(s): TOTAL KNEE ARTHROPLASTY  The patient history, physical exam, clinical judgement of the provider and imaging are consistent with end stage degenerative joint  disease and total joint arthroplasty is deemed medically necessary. The treatment options including medical management, injection therapy, and arthroplasty were discussed at length. The risks and benefits of Procedure(s): TOTAL KNEE ARTHROPLASTY were presented and reviewed.  The risks of nonoperative treatment, versus surgical intervention including but not limited to continued pain, aseptic loosening, stiffness, dislocation/subluxation, infection, bleeding, nerve injury, blood clots, cardiopulmonary complications, morbidity, mortality, among others were discussed. The patient verbalizes understanding and wishes to proceed with the plan.  Patient is being admitted for inpatient treatment for surgery, pain control, PT, OT, prophylactic antibiotics, VTE prophylaxis, progressive ambulation, ADL's and discharge planning.   Dental prophylaxis discussed and recommended for 2 years postoperatively.  He has dentures.   The patient does meet the criteria for TXA which will be used perioperatively.    ASA 81 mg BID will be used postoperatively for DVT prophylaxis in addition to SCDs, and early ambulation.  Plan for Tylenol, Gabapentin, Celebrex, oxycodone for pain.  Continue omeprazole for reflux / gastric protection.  The patient is planning to be discharged home with home health services ([facility]) in care of his wife.  Anticipated LOS less than 2 midnights.  Insurance approval for inpatient due to: - Age 76 and older with one or more of the following:  - Obesity  - Expected need for hospital services (PT, OT, Nursing) required for safe discharge  - Anticipated need for postoperative skilled nursing care or inpatient rehab  - Active co-morbidities: Diabetes   Prudencio Burly III, PA-C 03/24/2019 8:59 AM

## 2019-04-03 ENCOUNTER — Encounter (HOSPITAL_COMMUNITY): Payer: Self-pay

## 2019-04-03 NOTE — Patient Instructions (Addendum)
Thomas Wells    Your procedure is scheduled on: 04-15-2019  Report to [facility] Main  Entrance  Report to admitting at [place] [facility] TEST ON__6/19_____ @_______ , THIS TEST MUST BE DONE BEFORE SURGERY, COME TO [place].  BRING CPAP MASK AND TUBING   Call this number if you have problems the morning of surgery [phone]    Remember: . BRUSH YOUR TEETH MORNING OF SURGERY AND RINSE YOUR MOUTH OUT, NO CHEWING GUM CANDY OR MINTS.   NO SOLID FOOD AFTER MIDNIGHT THE NIGHT PRIOR TO SURGERY. NOTHING BY MOUTH EXCEPT CLEAR LIQUIDS UNTIL 430 AM.. PLEASE FINISH G2 DRINK PER SURGEON ORDER  NEEDS TO BE COMPLETED AT 430 AM.   CLEAR LIQUID DIET   Foods Allowed                                                                     Foods Excluded  Coffee and tea, regular and decaf                             liquids that you cannot  Plain Jell-O in any flavor                                             see through such as: Fruit ices (not with fruit pulp)                                     milk, soups, orange juice  Iced Popsicles                                    All solid food Carbonated beverages, regular and diet                                    Cranberry, grape and apple juices Sports drinks like Gatorade Lightly seasoned clear broth or consume(fat free) Sugar, honey syrup  Sample Menu Breakfast                                Lunch                                     Supper Cranberry juice                    Beef broth                            Chicken broth Jell-O  Grape juice                           Apple juice Coffee or tea                        Jell-O                                      Popsicle                                                Coffee or tea                        Coffee or  tea  _____________________________________________________________________     Take these medicines the morning of surgery with A SIP OF WATER: ALBUTEROL NEBULIZER, BRING ALBUTEROL INHALER AND ADVAIR INHALER AND USE IF NEEDED, DILTIAZEM (CARDIZEM CD)  DO NOT TAKE ANY DIABETIC MEDICATIONS DAY OF YOUR SURGERY                               You may not have any metal on your body including hair pins and              piercings  Do not wear jewelry, make-up, lotions, powders or perfumes, deodorant             Do not wear nail polish.  Do not shave  48 hours prior to surgery.              Men may shave face and neck.   Do not bring valuables to the hospital. [place].  Contacts, dentures or bridgework may not be worn into surgery.  Leave suitcase in the car. After surgery it may be brought to your room.     _____________________________________________________________________             [facility] - Preparing for Surgery Before surgery, you can play an important role.  Because skin is not sterile, your skin needs to be as free of germs as possible.  You can reduce the number of germs on your skin by washing with CHG (chlorahexidine gluconate) soap before surgery.  CHG is an antiseptic cleaner which kills germs and bonds with the skin to continue killing germs even after washing. Please DO NOT use if you have an allergy to CHG or antibacterial soaps.  If your skin becomes reddened/irritated stop using the CHG and inform your nurse when you arrive at Short Stay. Do not shave (including legs and underarms) for at least 48 hours prior to the first CHG shower.  You may shave your face/neck. Please follow these instructions carefully:  1.  Shower with CHG Soap the night before surgery and the  morning of Surgery.  2.  If you choose to wash your hair, wash your hair first as usual with your  normal  shampoo.  3.  After you shampoo, rinse your  hair and body thoroughly to remove the  shampoo.  4.  Use CHG as you would any other liquid soap.  You can apply chg directly  to the skin and wash                       Gently with a scrungie or clean washcloth.  5.  Apply the CHG Soap to your body ONLY FROM THE NECK DOWN.   Do not use on face/ open                           Wound or open sores. Avoid contact with eyes, ears mouth and genitals (private parts).                       Wash face,  Genitals (private parts) with your normal soap.             6.  Wash thoroughly, paying special attention to the area where your surgery  will be performed.  7.  Thoroughly rinse your body with warm water from the neck down.  8.  DO NOT shower/wash with your normal soap after using and rinsing off  the CHG Soap.                9.  Pat yourself dry with a clean towel.            10.  Wear clean pajamas.            11.  Place clean sheets on your bed the night of your first shower and do not  sleep with pets. Day of Surgery : Do not apply any lotions/deodorants the morning of surgery.  Please wear clean clothes to the hospital/surgery center.  FAILURE TO FOLLOW THESE INSTRUCTIONS MAY RESULT IN THE CANCELLATION OF YOUR SURGERY PATIENT SIGNATURE_________________________________  NURSE SIGNATURE__________________________________  ________________________________________________________________________   Thomas Wells  An incentive spirometer is a tool that can help keep your lungs clear and active. This tool measures how well you are filling your lungs with each breath. Taking long deep breaths may help reverse or decrease the chance of developing breathing (pulmonary) problems (especially infection) following:  A long period of time when you are unable to move or be active. BEFORE THE PROCEDURE   If the spirometer includes an indicator to show your best effort, your nurse or respiratory therapist will set  it to a desired goal.  If possible, sit up straight or lean slightly forward. Try not to slouch.  Hold the incentive spirometer in an upright position. INSTRUCTIONS FOR USE  1. Sit on the edge of your bed if possible, or sit up as far as you can in bed or on a chair. 2. Hold the incentive spirometer in an upright position. 3. Breathe out normally. 4. Place the mouthpiece in your mouth and seal your lips tightly around it. 5. Breathe in slowly and as deeply as possible, raising the piston or the ball toward the top of the column. 6. Hold your breath for 3-5 seconds or for as long as possible. Allow the piston or ball to fall to the bottom of the column. 7. Remove the mouthpiece from your mouth and breathe out normally. 8. Rest for a few seconds and repeat Steps 1 through 7 at least 10 times every 1-2 hours when you are awake. Take your time and take a few normal breaths between deep breaths. 9. The spirometer may include an indicator to show  your best effort. Use the indicator as a goal to work toward during each repetition. 10. After each set of 10 deep breaths, practice coughing to be sure your lungs are clear. If you have an incision (the cut made at the time of surgery), support your incision when coughing by placing a pillow or rolled up towels firmly against it. Once you are able to get out of bed, walk around indoors and cough well. You may stop using the incentive spirometer when instructed by your caregiver.  RISKS AND COMPLICATIONS  Take your time so you do not get dizzy or light-headed.  If you are in pain, you may need to take or ask for pain medication before doing incentive spirometry. It is harder to take a deep breath if you are having pain. AFTER USE  Rest and breathe slowly and easily.  It can be helpful to keep track of a log of your progress. Your caregiver can provide you with a simple table to help with this. If you are using the spirometer at home, follow these  instructions: [place] IF:   You are having difficultly using the spirometer.  You have trouble using the spirometer as often as instructed.  Your pain medication is not giving enough relief while using the spirometer.  You develop fever of 100.5 F (38.1 C) or higher. SEEK IMMEDIATE MEDICAL CARE IF:   You cough up bloody sputum that had not been present before.  You develop fever of 102 F (38.9 C) or greater.  You develop worsening pain at or near the incision site. MAKE SURE YOU:   Understand these instructions.  Will watch your condition.  Will get help right away if you are not doing well or get worse. Document Released: 02/19/2007 Document Revised: 01/01/2012 Document Reviewed: 04/22/2007 [facility] Patient Information 2014 [place], [place].   ________________________________________________________________________

## 2019-04-04 ENCOUNTER — Other Ambulatory Visit: Payer: Self-pay

## 2019-04-04 ENCOUNTER — Encounter (HOSPITAL_COMMUNITY): Payer: Self-pay

## 2019-04-04 ENCOUNTER — Encounter (HOSPITAL_COMMUNITY)
Admission: RE | Admit: 2019-04-04 | Discharge: 2019-04-04 | Disposition: A | Discharge status: Home / Self Care | Payer: Medicare Other | Source: Ambulatory Visit | Attending: Orthopedic Surgery | Admitting: Orthopedic Surgery

## 2019-04-04 DIAGNOSIS — Z01818 Encounter for other preprocedural examination: Secondary | ICD-10-CM | POA: Diagnosis not present

## 2019-04-04 DIAGNOSIS — E119 Type 2 diabetes mellitus without complications: Secondary | ICD-10-CM | POA: Insufficient documentation

## 2019-04-04 DIAGNOSIS — M1712 Unilateral primary osteoarthritis, left knee: Secondary | ICD-10-CM | POA: Insufficient documentation

## 2019-04-04 DIAGNOSIS — I440 Atrioventricular block, first degree: Secondary | ICD-10-CM | POA: Insufficient documentation

## 2019-04-04 HISTORY — DX: Myoneural disorder, unspecified: G70.9

## 2019-04-04 HISTORY — DX: Unspecified osteoarthritis, unspecified site: M19.90

## 2019-04-04 LAB — CBC
HCT: 39.7 % (ref 39.0–52.0)
Hemoglobin: 12.8 g/dL — ABNORMAL LOW (ref 13.0–17.0)
MCH: 32 pg (ref 26.0–34.0)
MCHC: 32.2 g/dL (ref 30.0–36.0)
MCV: 99.3 fL (ref 80.0–100.0)
Platelets: 184 10*3/uL (ref 150–400)
RBC: 4 MIL/uL — ABNORMAL LOW (ref 4.22–5.81)
RDW: 11.9 % (ref 11.5–15.5)
WBC: 6.4 10*3/uL (ref 4.0–10.5)
nRBC: 0 % (ref 0.0–0.2)

## 2019-04-04 LAB — URINALYSIS, ROUTINE W REFLEX MICROSCOPIC
Bilirubin Urine: NEGATIVE
Glucose, UA: NEGATIVE mg/dL
Hgb urine dipstick: NEGATIVE
Ketones, ur: NEGATIVE mg/dL
Leukocytes,Ua: NEGATIVE
Nitrite: NEGATIVE
Protein, ur: NEGATIVE mg/dL
Specific Gravity, Urine: 1.019 (ref 1.005–1.030)
pH: 5 (ref 5.0–8.0)

## 2019-04-04 LAB — GLUCOSE, CAPILLARY: Glucose-Capillary: 135 mg/dL — ABNORMAL HIGH (ref 70–99)

## 2019-04-04 LAB — HEMOGLOBIN A1C
Hgb A1c MFr Bld: 5.8 % — ABNORMAL HIGH (ref 4.8–5.6)
Mean Plasma Glucose: 119.76 mg/dL

## 2019-04-04 LAB — BASIC METABOLIC PANEL
Anion gap: 5 (ref 5–15)
BUN: 24 mg/dL — ABNORMAL HIGH (ref 8–23)
CO2: 26 mmol/L (ref 22–32)
Calcium: 9.1 mg/dL (ref 8.9–10.3)
Chloride: 107 mmol/L (ref 98–111)
Creatinine, Ser: 0.83 mg/dL (ref 0.61–1.24)
GFR calc Af Amer: >60 mL/min (ref >60)
GFR calc non Af Amer: >60 mL/min (ref >60)
Glucose, Bld: 92 mg/dL (ref 70–99)
Potassium: 4.7 mmol/L (ref 3.5–5.1)
Sodium: 138 mmol/L (ref 135–145)

## 2019-04-04 LAB — SURGICAL PCR SCREEN
MRSA, PCR: NEGATIVE
Staphylococcus aureus: NEGATIVE

## 2019-04-04 NOTE — Progress Notes (Signed)
[name] PA   Patient has stopped his ASA 04/04/19 per Dr. Percell Miller. His PCP prescribes it. Dr Lisbeth Ply. Pt's EKG shows 1st degree AV block

## 2019-04-10 NOTE — Progress Notes (Signed)
Anesthesia Chart Review   Case: [id number] Date/Time: 04/15/19 0945   Procedure: LEFT TOTAL KNEE ARTHROPLASTY (Left )   Anesthesia type: Choice   Pre-op diagnosis: OA LEFT  KNEE   Location: [facility] ROOM 08 / [facility]   Surgeon: Renette Butters, MD       DISCUSSION: 68 yo former smoker (75 pack years, quit 10/24/95) with h/o COPD, GERD, DM II, Chronic Hepatitis C (treated) left knee OA scheduled for above procedure 04/15/2019 with Dr. Edmonia Lynch.   Pt cleared by PCP, Dr. Jefm Petty, "cleared from every perspective except pulmonary since he has severe COPD diagnosis."  Per Pulmonlogist, Dr. Camillo Flaming, on 03/20/2019 "Okay from pulmonary standpoint for surgical clearance."  EKG at PAT visit with note from Dr. Thompson Grayer stating ST elevation, consider early repolarization, pericarditis, or injury, new from prior ekg.  Clinical correlation is advised.  Pt asymptomatic at PAT visit.  EKG forwarded to PCP for review.  Pt seen in office by PCP on 04/09/2019.  Per Dr. Jeralene Huff note, "Repeat EKG shows no ST changes and he is asymptomatic.  Suspect ekg changes seen on cardiogram from [place] represented vacillating baseline and that worst mild early repolarization.  Will contact Dr. Percell Miller with this information to let him know that is does not appear anything is actively going on."  Pt can proceed with planned procedure barring acute status change.  VS: BP (!) 137/57   Pulse 66   Temp 36.7 C (Oral)   Resp 18   Ht 5\' 10"  (1.778 m)   Wt 78.7 kg   SpO2 98%   BMI 24.88 kg/m   PROVIDERS: Jefm Petty, MD is PCP   Francena Hanly, MD is Pulmonologist  LABS: Labs reviewed: Acceptable for surgery. (all labs ordered are listed, but only abnormal results are displayed)  Labs Reviewed  GLUCOSE, CAPILLARY - Abnormal; Notable for the following components:      Result Value   Glucose-Capillary 135 (*)    All other components within normal limits  BASIC METABOLIC PANEL - Abnormal; Notable for the  following components:   BUN 24 (*)    All other components within normal limits  CBC - Abnormal; Notable for the following components:   RBC 4.00 (*)    Hemoglobin 12.8 (*)    All other components within normal limits  HEMOGLOBIN A1C - Abnormal; Notable for the following components:   Hgb A1c MFr Bld 5.8 (*)    All other components within normal limits  SURGICAL PCR SCREEN  URINALYSIS, ROUTINE W REFLEX MICROSCOPIC     IMAGES:   EKG: 04/04/2019 Rate 65 bpm Sinus rhythm with 1st degree AV block  ST elevation, consider early repolarization, pericarditis, or injury, new from prior ekg, clinical correlation is advised.   CV:  Past Medical History:  Diagnosis Date  . Arthritis    Knees, fingers  . Chronic hepatitis C ([place])    1990s had treatment  . COPD (chronic obstructive pulmonary disease) ([place])   . DM2 (diabetes mellitus, type 2) ([facility])    Dr. Jeralene Huff  . GERD (gastroesophageal reflux disease)   . Insomnia   . Lipoma   . 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 Obesity   . Pure hypercholesterolemia   . Vitamin D deficiency     Past Surgical History:  Procedure Laterality Date  . BACK SURGERY  1989   L3, fused L2 and L4  . FINGER AMPUTATION  10/1968    MEDICATIONS: .  acetaminophen (TYLENOL) 650 MG CR tablet  . albuterol (PROVENTIL) (2.5 MG/3ML) 0.083% nebulizer solution  . albuterol (VENTOLIN HFA) 108 (90 BASE) MCG/ACT inhaler  . aspirin EC 81 MG tablet  . betamethasone dipropionate (DIPROLENE) 0.05 % cream  . Cholecalciferol (VITAMIN D3) 25 MCG (1000 UT) CAPS  . diltiazem (CARDIZEM CD) 180 MG 24 hr capsule  . Fluticasone-Salmeterol (ADVAIR) 250-50 MCG/DOSE AEPB  . Garlic 8478 MG CAPS  . ibuprofen (ADVIL) 200 MG tablet  . lisinopril (ZESTRIL) 20 MG tablet  . magnesium gluconate (MAGONATE) 500 MG tablet  . Omega-3 Fatty Acids (FISH OIL) 1200 MG CAPS  . omeprazole (PRILOSEC) 20 MG capsule  . spironolactone (ALDACTONE) 25 MG tablet  . vitamin B-12  (CYANOCOBALAMIN) 1000 MCG tablet   No current facility-administered medications for this encounter.    Maia Plan [facility] Pre-Surgical Testing  [phone] 04/10/19 12:30 PM

## 2019-04-10 NOTE — Anesthesia Preprocedure Evaluation (Addendum)
Anesthesia Evaluation  Patient identified by MRN, date of birth, ID band Patient awake    Reviewed: Allergy & Precautions, NPO status , Patient's Chart, lab work & pertinent test results  Airway Mallampati: II  TM Distance: >3 FB Neck ROM: Full    Dental no notable dental hx.    Pulmonary sleep apnea , COPD, former smoker,    Pulmonary exam normal breath sounds clear to auscultation       Cardiovascular hypertension, Normal cardiovascular exam Rhythm:Regular Rate:Normal     Neuro/Psych negative neurological ROS  negative psych ROS   GI/Hepatic Neg liver ROS, GERD  ,  Endo/Other  diabetes  Renal/GU negative Renal ROS  negative genitourinary   Musculoskeletal negative musculoskeletal ROS (+)   Abdominal   Peds negative pediatric ROS (+)  Hematology negative hematology ROS (+)   Anesthesia Other Findings   Reproductive/Obstetrics negative OB ROS                            Anesthesia Physical Anesthesia Plan  ASA: III  Anesthesia Plan: Spinal   Post-op Pain Management:  Regional for Post-op pain   Induction: Intravenous  PONV Risk Score and Plan: 1 and Ondansetron  Airway Management Planned: Simple Face Mask  Additional Equipment:   Intra-op Plan:   Post-operative Plan:   Informed Consent: I have reviewed the patients History and Physical, chart, labs and discussed the procedure including the risks, benefits and alternatives for the proposed anesthesia with the patient or authorized representative who has indicated his/her understanding and acceptance.     Dental advisory given  Plan Discussed with: CRNA and Surgeon  Anesthesia Plan Comments: (See PAT note 04/04/2019, [name], PA-C)       Anesthesia Quick Evaluation

## 2019-04-11 ENCOUNTER — Other Ambulatory Visit (HOSPITAL_COMMUNITY): Payer: Medicare Other

## 2019-04-11 ENCOUNTER — Other Ambulatory Visit (HOSPITAL_COMMUNITY)
Admission: RE | Admit: 2019-04-11 | Discharge: 2019-04-11 | Disposition: A | Discharge status: Home / Self Care | Payer: Medicare Other | Source: Ambulatory Visit | Attending: Orthopedic Surgery | Admitting: Orthopedic Surgery

## 2019-04-11 DIAGNOSIS — Z1159 Encounter for screening for other viral diseases: Secondary | ICD-10-CM | POA: Diagnosis present

## 2019-04-11 LAB — SARS CORONAVIRUS 2 (TAT 6-24 HRS): SARS Coronavirus 2: NEGATIVE

## 2019-04-14 MED ORDER — BUPIVACAINE LIPOSOME 1.3 % IJ SUSP
20.0000 mL | Freq: Once | INTRAMUSCULAR | Status: DC
  Filled 2019-04-14: qty 20

## 2019-04-15 ENCOUNTER — Encounter (HOSPITAL_COMMUNITY): Payer: Self-pay

## 2019-04-15 ENCOUNTER — Inpatient Hospital Stay (HOSPITAL_COMMUNITY): Payer: Medicare Other

## 2019-04-15 ENCOUNTER — Inpatient Hospital Stay (HOSPITAL_COMMUNITY): Payer: Medicare Other | Admitting: Registered Nurse

## 2019-04-15 ENCOUNTER — Inpatient Hospital Stay (HOSPITAL_COMMUNITY): Payer: Medicare Other | Admitting: Physician Assistant

## 2019-04-15 ENCOUNTER — Other Ambulatory Visit: Payer: Self-pay

## 2019-04-15 ENCOUNTER — Inpatient Hospital Stay (HOSPITAL_COMMUNITY)
Admission: RE | Admit: 2019-04-15 | Discharge: 2019-04-16 | DRG: 470 | Disposition: A | Discharge status: Home Health | Payer: Medicare Other | Source: Other Acute Inpatient Hospital | Attending: Orthopedic Surgery | Admitting: Orthopedic Surgery

## 2019-04-15 ENCOUNTER — Encounter (HOSPITAL_COMMUNITY)
Admission: RE | Disposition: A | Discharge status: Home Health | Payer: Self-pay | Source: Other Acute Inpatient Hospital | Attending: Orthopedic Surgery

## 2019-04-15 DIAGNOSIS — K219 Gastro-esophageal reflux disease without esophagitis: Secondary | ICD-10-CM | POA: Diagnosis present

## 2019-04-15 DIAGNOSIS — Z808 Family history of malignant neoplasm of other organs or systems: Secondary | ICD-10-CM

## 2019-04-15 DIAGNOSIS — J449 Chronic obstructive pulmonary disease, unspecified: Secondary | ICD-10-CM | POA: Diagnosis present

## 2019-04-15 DIAGNOSIS — Z885 Allergy status to narcotic agent status: Secondary | ICD-10-CM | POA: Diagnosis not present

## 2019-04-15 DIAGNOSIS — E559 Vitamin D deficiency, unspecified: Secondary | ICD-10-CM | POA: Diagnosis present

## 2019-04-15 DIAGNOSIS — R609 Edema, unspecified: Secondary | ICD-10-CM | POA: Diagnosis present

## 2019-04-15 DIAGNOSIS — Z8249 Family history of ischemic heart disease and other diseases of the circulatory system: Secondary | ICD-10-CM

## 2019-04-15 DIAGNOSIS — M1712 Unilateral primary osteoarthritis, left knee: Secondary | ICD-10-CM

## 2019-04-15 DIAGNOSIS — Z888 Allergy status to other drugs, medicaments and biological substances status: Secondary | ICD-10-CM | POA: Diagnosis not present

## 2019-04-15 DIAGNOSIS — I10 Essential (primary) hypertension: Secondary | ICD-10-CM | POA: Diagnosis present

## 2019-04-15 DIAGNOSIS — B182 Chronic viral hepatitis C: Secondary | ICD-10-CM | POA: Diagnosis present

## 2019-04-15 DIAGNOSIS — Z87891 Personal history of nicotine dependence: Secondary | ICD-10-CM | POA: Diagnosis not present

## 2019-04-15 DIAGNOSIS — E7800 Pure hypercholesterolemia, unspecified: Secondary | ICD-10-CM | POA: Diagnosis present

## 2019-04-15 DIAGNOSIS — Z89029 Acquired absence of unspecified finger(s): Secondary | ICD-10-CM | POA: Diagnosis not present

## 2019-04-15 DIAGNOSIS — Z6837 Body mass index (BMI) 37.0-37.9, adult: Secondary | ICD-10-CM | POA: Diagnosis not present

## 2019-04-15 DIAGNOSIS — Z79899 Other long term (current) drug therapy: Secondary | ICD-10-CM | POA: Diagnosis not present

## 2019-04-15 DIAGNOSIS — E119 Type 2 diabetes mellitus without complications: Secondary | ICD-10-CM | POA: Diagnosis present

## 2019-04-15 DIAGNOSIS — M170 Bilateral primary osteoarthritis of knee: Principal | ICD-10-CM | POA: Diagnosis present

## 2019-04-15 DIAGNOSIS — M25762 Osteophyte, left knee: Secondary | ICD-10-CM | POA: Diagnosis present

## 2019-04-15 DIAGNOSIS — G4733 Obstructive sleep apnea (adult) (pediatric): Secondary | ICD-10-CM | POA: Diagnosis present

## 2019-04-15 DIAGNOSIS — R112 Nausea with vomiting, unspecified: Secondary | ICD-10-CM | POA: Diagnosis not present

## 2019-04-15 DIAGNOSIS — Z96659 Presence of unspecified artificial knee joint: Secondary | ICD-10-CM

## 2019-04-15 DIAGNOSIS — M25562 Pain in left knee: Secondary | ICD-10-CM | POA: Diagnosis present

## 2019-04-15 DIAGNOSIS — E669 Obesity, unspecified: Secondary | ICD-10-CM | POA: Diagnosis present

## 2019-04-15 DIAGNOSIS — M1711 Unilateral primary osteoarthritis, right knee: Secondary | ICD-10-CM | POA: Diagnosis present

## 2019-04-15 DIAGNOSIS — M1710 Unilateral primary osteoarthritis, unspecified knee: Secondary | ICD-10-CM | POA: Diagnosis present

## 2019-04-15 HISTORY — PX: TOTAL KNEE ARTHROPLASTY: SHX125

## 2019-04-15 LAB — GLUCOSE, CAPILLARY
Glucose-Capillary: 119 mg/dL — ABNORMAL HIGH (ref 70–99)
Glucose-Capillary: 116 mg/dL — ABNORMAL HIGH (ref 70–99)
Glucose-Capillary: 146 mg/dL — ABNORMAL HIGH (ref 70–99)
Glucose-Capillary: 135 mg/dL — ABNORMAL HIGH (ref 70–99)

## 2019-04-15 SURGERY — ARTHROPLASTY, KNEE, TOTAL
Anesthesia: Spinal | Site: Knee | Laterality: Left

## 2019-04-15 MED ORDER — SODIUM CHLORIDE (PF) 0.9 % IJ SOLN
INTRAMUSCULAR | Status: AC
  Filled 2019-04-15: qty 50

## 2019-04-15 MED ORDER — FENTANYL CITRATE (PF) 100 MCG/2ML IJ SOLN
INTRAMUSCULAR | Status: AC
  Administered 2019-04-15: 09:00:00 50 ug
  Filled 2019-04-15: qty 2

## 2019-04-15 MED ORDER — SODIUM CHLORIDE 0.9 % IR SOLN
Status: DC | PRN
  Administered 2019-04-15: 1000 mL

## 2019-04-15 MED ORDER — ACETAMINOPHEN 325 MG PO TABS: 325.0000 mg | ORAL_TABLET | Freq: Four times a day (QID) | ORAL | Status: DC | PRN

## 2019-04-15 MED ORDER — DEXAMETHASONE SODIUM PHOSPHATE 10 MG/ML IJ SOLN
INTRAMUSCULAR | Status: DC | PRN
  Administered 2019-04-15: 5 mg via INTRAVENOUS

## 2019-04-15 MED ORDER — MAGNESIUM CITRATE PO SOLN: 1.0000 | Freq: Once | ORAL | Status: DC | PRN

## 2019-04-15 MED ORDER — MAGNESIUM GLUCONATE 500 MG PO TABS
500.0000 mg | ORAL_TABLET | Freq: Every day | ORAL | Status: DC
  Administered 2019-04-16: 10:00:00 500 mg via ORAL
  Filled 2019-04-15: qty 1

## 2019-04-15 MED ORDER — LIDOCAINE 2% (20 MG/ML) 5 ML SYRINGE
INTRAMUSCULAR | Status: DC | PRN
  Administered 2019-04-15: 40 mg via INTRAVENOUS

## 2019-04-15 MED ORDER — CEFAZOLIN SODIUM-DEXTROSE 1-4 GM/50ML-% IV SOLN
1.0000 g | Freq: Four times a day (QID) | INTRAVENOUS | Status: AC
  Administered 2019-04-15 (×2): 1 g via INTRAVENOUS
  Filled 2019-04-15 (×2): qty 50

## 2019-04-15 MED ORDER — PROMETHAZINE HCL 25 MG/ML IJ SOLN: 6.2500 mg | INTRAMUSCULAR | Status: DC | PRN

## 2019-04-15 MED ORDER — DIPHENHYDRAMINE HCL 12.5 MG/5ML PO ELIX
12.5000 mg | ORAL_SOLUTION | ORAL | Status: DC | PRN
  Administered 2019-04-16: 01:00:00 25 mg via ORAL
  Filled 2019-04-15: qty 10

## 2019-04-15 MED ORDER — ALBUTEROL SULFATE (2.5 MG/3ML) 0.083% IN NEBU: 2.5000 mg | INHALATION_SOLUTION | Freq: Four times a day (QID) | RESPIRATORY_TRACT | Status: DC | PRN

## 2019-04-15 MED ORDER — ACETAMINOPHEN 500 MG PO TABS: 1000.0000 mg | ORAL_TABLET | Freq: Three times a day (TID) | ORAL | 0 refills | Status: AC

## 2019-04-15 MED ORDER — ONDANSETRON HCL 4 MG PO TABS: 4.0000 mg | ORAL_TABLET | Freq: Four times a day (QID) | ORAL | Status: DC | PRN

## 2019-04-15 MED ORDER — PROPOFOL 10 MG/ML IV BOLUS
INTRAVENOUS | Status: AC
  Filled 2019-04-15: qty 60

## 2019-04-15 MED ORDER — CLONIDINE HCL (ANALGESIA) 100 MCG/ML EP SOLN
EPIDURAL | Status: DC | PRN
  Administered 2019-04-15: 70 ug

## 2019-04-15 MED ORDER — ROPIVACAINE HCL 7.5 MG/ML IJ SOLN
INTRAMUSCULAR | Status: DC | PRN
  Administered 2019-04-15: 20 mL via PERINEURAL

## 2019-04-15 MED ORDER — STERILE WATER FOR IRRIGATION IR SOLN
Status: DC | PRN
  Administered 2019-04-15: 2000 mL

## 2019-04-15 MED ORDER — FLUTICASONE FUROATE-VILANTEROL 200-25 MCG/INH IN AEPB
1.0000 | INHALATION_SPRAY | Freq: Every day | RESPIRATORY_TRACT | Status: DC
  Administered 2019-04-16: 1 via RESPIRATORY_TRACT
  Filled 2019-04-15: qty 28

## 2019-04-15 MED ORDER — BUPIVACAINE IN DEXTROSE 0.75-8.25 % IT SOLN
INTRATHECAL | Status: DC | PRN
  Administered 2019-04-15: 1.8 mL via INTRATHECAL

## 2019-04-15 MED ORDER — CELECOXIB 200 MG PO CAPS: 200.0000 mg | ORAL_CAPSULE | Freq: Two times a day (BID) | ORAL | 0 refills | Status: AC

## 2019-04-15 MED ORDER — CEFAZOLIN SODIUM-DEXTROSE 2-4 GM/100ML-% IV SOLN
INTRAVENOUS | Status: AC
  Filled 2019-04-15: qty 100

## 2019-04-15 MED ORDER — 0.9 % SODIUM CHLORIDE (POUR BTL) OPTIME
TOPICAL | Status: DC | PRN
  Administered 2019-04-15: 1000 mL

## 2019-04-15 MED ORDER — ACETAMINOPHEN 500 MG PO TABS
1000.0000 mg | ORAL_TABLET | Freq: Three times a day (TID) | ORAL | Status: DC
  Administered 2019-04-15 – 2019-04-16 (×3): 1000 mg via ORAL
  Filled 2019-04-15 (×3): qty 2

## 2019-04-15 MED ORDER — TRANEXAMIC ACID-NACL 1000-0.7 MG/100ML-% IV SOLN
1000.0000 mg | INTRAVENOUS | Status: AC
  Administered 2019-04-15: 10:00:00 1000 mg via INTRAVENOUS

## 2019-04-15 MED ORDER — LIDOCAINE 2% (20 MG/ML) 5 ML SYRINGE
INTRAMUSCULAR | Status: AC
  Filled 2019-04-15: qty 5

## 2019-04-15 MED ORDER — ASPIRIN EC 81 MG PO TBEC: 81.0000 mg | DELAYED_RELEASE_TABLET | Freq: Two times a day (BID) | ORAL | 0 refills | Status: DC

## 2019-04-15 MED ORDER — LISINOPRIL 20 MG PO TABS
20.0000 mg | ORAL_TABLET | Freq: Every day | ORAL | Status: DC
  Filled 2019-04-15: qty 1

## 2019-04-15 MED ORDER — CHLORHEXIDINE GLUCONATE 4 % EX LIQD: 60.0000 mL | Freq: Once | CUTANEOUS | Status: DC

## 2019-04-15 MED ORDER — METHOCARBAMOL 500 MG IVPB - SIMPLE MED
500.0000 mg | Freq: Four times a day (QID) | INTRAVENOUS | Status: DC | PRN
  Filled 2019-04-15: qty 50

## 2019-04-15 MED ORDER — ACETAMINOPHEN 500 MG PO TABS
ORAL_TABLET | ORAL | Status: AC
  Administered 2019-04-15: 1000 mg via ORAL
  Filled 2019-04-15: qty 1

## 2019-04-15 MED ORDER — GABAPENTIN 300 MG PO CAPS
300.0000 mg | ORAL_CAPSULE | Freq: Three times a day (TID) | ORAL | Status: DC
  Administered 2019-04-15 – 2019-04-16 (×4): 300 mg via ORAL
  Filled 2019-04-15 (×4): qty 1

## 2019-04-15 MED ORDER — ONDANSETRON HCL 4 MG/2ML IJ SOLN
INTRAMUSCULAR | Status: DC | PRN
  Administered 2019-04-15: 4 mg via INTRAVENOUS

## 2019-04-15 MED ORDER — ACETAMINOPHEN 500 MG PO TABS
1000.0000 mg | ORAL_TABLET | Freq: Once | ORAL | Status: AC
  Administered 2019-04-15: 08:00:00 1000 mg via ORAL

## 2019-04-15 MED ORDER — METHOCARBAMOL 500 MG PO TABS: 500.0000 mg | ORAL_TABLET | Freq: Three times a day (TID) | ORAL | 0 refills | Status: DC | PRN

## 2019-04-15 MED ORDER — PANTOPRAZOLE SODIUM 40 MG PO TBEC
40.0000 mg | DELAYED_RELEASE_TABLET | Freq: Every day | ORAL | Status: DC
  Administered 2019-04-16: 40 mg via ORAL
  Filled 2019-04-15: qty 1

## 2019-04-15 MED ORDER — INSULIN ASPART 100 UNIT/ML ~~LOC~~ SOLN
0.0000 [IU] | Freq: Three times a day (TID) | SUBCUTANEOUS | Status: DC
  Administered 2019-04-15: 16:00:00 2 [IU] via SUBCUTANEOUS
  Administered 2019-04-16: 13:00:00 3 [IU] via SUBCUTANEOUS
  Administered 2019-04-16: 2 [IU] via SUBCUTANEOUS

## 2019-04-15 MED ORDER — PROPOFOL 10 MG/ML IV BOLUS
INTRAVENOUS | Status: AC
  Filled 2019-04-15: qty 20

## 2019-04-15 MED ORDER — EPHEDRINE 5 MG/ML INJ
INTRAVENOUS | Status: AC
  Filled 2019-04-15: qty 10

## 2019-04-15 MED ORDER — DILTIAZEM HCL ER COATED BEADS 180 MG PO CP24
180.0000 mg | ORAL_CAPSULE | Freq: Every day | ORAL | Status: DC
  Administered 2019-04-16: 10:00:00 180 mg via ORAL
  Filled 2019-04-15: qty 1

## 2019-04-15 MED ORDER — ASPIRIN 81 MG PO CHEW
81.0000 mg | CHEWABLE_TABLET | Freq: Two times a day (BID) | ORAL | Status: DC
  Administered 2019-04-15: 21:00:00 81 mg via ORAL
  Filled 2019-04-15 (×2): qty 1

## 2019-04-15 MED ORDER — MIDAZOLAM HCL 2 MG/2ML IJ SOLN: 1.0000 mg | INTRAMUSCULAR | Status: DC

## 2019-04-15 MED ORDER — METOCLOPRAMIDE HCL 5 MG PO TABS: 5.0000 mg | ORAL_TABLET | Freq: Three times a day (TID) | ORAL | Status: DC | PRN

## 2019-04-15 MED ORDER — CEFAZOLIN SODIUM-DEXTROSE 2-4 GM/100ML-% IV SOLN
2.0000 g | INTRAVENOUS | Status: AC
  Administered 2019-04-15 (×2): 2 g via INTRAVENOUS

## 2019-04-15 MED ORDER — DOCUSATE SODIUM 100 MG PO CAPS
100.0000 mg | ORAL_CAPSULE | Freq: Two times a day (BID) | ORAL | Status: DC
  Administered 2019-04-15 – 2019-04-16 (×2): 100 mg via ORAL
  Filled 2019-04-15 (×2): qty 1

## 2019-04-15 MED ORDER — OXYCODONE HCL 5 MG PO TABS: 5.0000 mg | ORAL_TABLET | ORAL | 0 refills | Status: AC | PRN

## 2019-04-15 MED ORDER — PROPOFOL 500 MG/50ML IV EMUL
INTRAVENOUS | Status: DC | PRN
  Administered 2019-04-15: 75 ug/kg/min via INTRAVENOUS

## 2019-04-15 MED ORDER — MENTHOL 3 MG MT LOZG: 1.0000 | LOZENGE | OROMUCOSAL | Status: DC | PRN

## 2019-04-15 MED ORDER — LACTATED RINGERS IV SOLN
INTRAVENOUS | Status: DC
  Administered 2019-04-15 (×2): via INTRAVENOUS

## 2019-04-15 MED ORDER — HYDROMORPHONE HCL 1 MG/ML IJ SOLN
0.5000 mg | INTRAMUSCULAR | Status: DC | PRN
  Administered 2019-04-15 (×2): 1 mg via INTRAVENOUS
  Filled 2019-04-15 (×2): qty 1

## 2019-04-15 MED ORDER — DEXAMETHASONE SODIUM PHOSPHATE 10 MG/ML IJ SOLN
INTRAMUSCULAR | Status: AC
  Filled 2019-04-15: qty 1

## 2019-04-15 MED ORDER — MIDAZOLAM HCL 2 MG/2ML IJ SOLN
INTRAMUSCULAR | Status: AC
  Administered 2019-04-15: 09:00:00 1 mg
  Filled 2019-04-15: qty 2

## 2019-04-15 MED ORDER — DOCUSATE SODIUM 100 MG PO CAPS: 100.0000 mg | ORAL_CAPSULE | Freq: Two times a day (BID) | ORAL | 0 refills | Status: DC

## 2019-04-15 MED ORDER — METOCLOPRAMIDE HCL 5 MG/ML IJ SOLN
5.0000 mg | Freq: Three times a day (TID) | INTRAMUSCULAR | Status: DC | PRN
  Administered 2019-04-15: 23:00:00 10 mg via INTRAVENOUS
  Filled 2019-04-15: qty 2

## 2019-04-15 MED ORDER — ONDANSETRON HCL 4 MG PO TABS: 4.0000 mg | ORAL_TABLET | Freq: Three times a day (TID) | ORAL | 0 refills | Status: DC | PRN

## 2019-04-15 MED ORDER — POLYETHYLENE GLYCOL 3350 17 G PO PACK: 17.0000 g | PACK | Freq: Every day | ORAL | Status: DC | PRN

## 2019-04-15 MED ORDER — LACTATED RINGERS IV SOLN
INTRAVENOUS | Status: DC
  Administered 2019-04-15 – 2019-04-16 (×2): via INTRAVENOUS

## 2019-04-15 MED ORDER — FENTANYL CITRATE (PF) 100 MCG/2ML IJ SOLN: 50.0000 ug | INTRAMUSCULAR | Status: DC

## 2019-04-15 MED ORDER — SORBITOL 70 % SOLN
30.0000 mL | Freq: Every day | Status: DC | PRN
  Filled 2019-04-15: qty 30

## 2019-04-15 MED ORDER — BUPIVACAINE LIPOSOME 1.3 % IJ SUSP
INTRAMUSCULAR | Status: DC | PRN
  Administered 2019-04-15: 20 mL

## 2019-04-15 MED ORDER — HYDROMORPHONE HCL 1 MG/ML IJ SOLN: 0.2500 mg | INTRAMUSCULAR | Status: DC | PRN

## 2019-04-15 MED ORDER — PROPOFOL 10 MG/ML IV BOLUS
INTRAVENOUS | Status: DC | PRN
  Administered 2019-04-15: 20 mg via INTRAVENOUS
  Administered 2019-04-15: 30 mg via INTRAVENOUS

## 2019-04-15 MED ORDER — ACETAMINOPHEN 500 MG PO TABS
ORAL_TABLET | ORAL | Status: AC
  Filled 2019-04-15: qty 1

## 2019-04-15 MED ORDER — SODIUM CHLORIDE 0.9% FLUSH
INTRAVENOUS | Status: DC | PRN
  Administered 2019-04-15: 30 mL via INTRAVENOUS

## 2019-04-15 MED ORDER — DEXAMETHASONE SODIUM PHOSPHATE 10 MG/ML IJ SOLN
10.0000 mg | Freq: Once | INTRAMUSCULAR | Status: AC
  Administered 2019-04-16: 06:00:00 10 mg via INTRAVENOUS
  Filled 2019-04-15: qty 1

## 2019-04-15 MED ORDER — GABAPENTIN 300 MG PO CAPS: 300.0000 mg | ORAL_CAPSULE | Freq: Two times a day (BID) | ORAL | 0 refills | Status: DC

## 2019-04-15 MED ORDER — POVIDONE-IODINE 10 % EX SWAB: 2.0000 "application " | Freq: Once | CUTANEOUS | Status: DC

## 2019-04-15 MED ORDER — TRANEXAMIC ACID-NACL 1000-0.7 MG/100ML-% IV SOLN
INTRAVENOUS | Status: AC
  Filled 2019-04-15: qty 100

## 2019-04-15 MED ORDER — PHENOL 1.4 % MT LIQD: 1.0000 | OROMUCOSAL | Status: DC | PRN

## 2019-04-15 MED ORDER — ONDANSETRON HCL 4 MG/2ML IJ SOLN
INTRAMUSCULAR | Status: AC
  Filled 2019-04-15: qty 2

## 2019-04-15 MED ORDER — CELECOXIB 200 MG PO CAPS
200.0000 mg | ORAL_CAPSULE | Freq: Two times a day (BID) | ORAL | Status: DC
  Administered 2019-04-15 – 2019-04-16 (×2): 200 mg via ORAL
  Filled 2019-04-15 (×2): qty 1

## 2019-04-15 MED ORDER — ALBUTEROL SULFATE HFA 108 (90 BASE) MCG/ACT IN AERS: 2.0000 | INHALATION_SPRAY | Freq: Four times a day (QID) | RESPIRATORY_TRACT | Status: DC | PRN

## 2019-04-15 MED ORDER — ONDANSETRON HCL 4 MG/2ML IJ SOLN
4.0000 mg | Freq: Four times a day (QID) | INTRAMUSCULAR | Status: DC | PRN
  Administered 2019-04-15: 21:00:00 4 mg via INTRAVENOUS
  Filled 2019-04-15: qty 2

## 2019-04-15 MED ORDER — SPIRONOLACTONE 25 MG PO TABS
25.0000 mg | ORAL_TABLET | Freq: Every day | ORAL | Status: DC
  Administered 2019-04-16: 10:00:00 25 mg via ORAL
  Filled 2019-04-15 (×2): qty 1

## 2019-04-15 MED ORDER — EPHEDRINE SULFATE-NACL 50-0.9 MG/10ML-% IV SOSY
PREFILLED_SYRINGE | INTRAVENOUS | Status: DC | PRN
  Administered 2019-04-15: 10 mg via INTRAVENOUS
  Administered 2019-04-15: 15 mg via INTRAVENOUS

## 2019-04-15 MED ORDER — METHOCARBAMOL 500 MG PO TABS
500.0000 mg | ORAL_TABLET | Freq: Four times a day (QID) | ORAL | Status: DC | PRN
  Administered 2019-04-15 – 2019-04-16 (×2): 500 mg via ORAL
  Filled 2019-04-15 (×2): qty 1

## 2019-04-15 MED ORDER — OXYCODONE HCL 5 MG PO TABS
5.0000 mg | ORAL_TABLET | ORAL | Status: DC | PRN
  Administered 2019-04-15 – 2019-04-16 (×4): 10 mg via ORAL
  Filled 2019-04-15 (×4): qty 2

## 2019-04-15 SURGICAL SUPPLY — 51 items
BLADE HEX COATED 2.75 (ELECTRODE) ×3 IMPLANT
BLADE SAG 18X100X1.27 (BLADE) ×3 IMPLANT
BLADE SAGITTAL 25.0X1.37X90 (BLADE) ×2 IMPLANT
BLADE SAGITTAL 25.0X1.37X90MM (BLADE) ×1
BLADE SURG 15 STRL LF DISP TIS (BLADE) ×1 IMPLANT
BLADE SURG 15 STRL SS (BLADE) ×2
BLADE SURG SZ10 CARB STEEL (BLADE) ×6 IMPLANT
BNDG ELASTIC 6X10 VLCR STRL LF (GAUZE/BANDAGES/DRESSINGS) ×3 IMPLANT
BOWL SMART MIX CTS (DISPOSABLE) IMPLANT
CHLORAPREP W/TINT 26 (MISCELLANEOUS) ×6 IMPLANT
CLOSURE STERI-STRIP 1/2X4 (GAUZE/BANDAGES/DRESSINGS) ×2
CLSR STERI-STRIP ANTIMIC 1/2X4 (GAUZE/BANDAGES/DRESSINGS) ×3 IMPLANT
COVER SURGICAL LIGHT HANDLE (MISCELLANEOUS) ×3 IMPLANT
COVER WAND RF STERILE (DRAPES) ×2 IMPLANT
CUFF TOURN SGL QUICK 34 (TOURNIQUET CUFF) ×2
CUFF TRNQT CYL 34X4.125X (TOURNIQUET CUFF) ×1 IMPLANT
DECANTER SPIKE VIAL GLASS SM (MISCELLANEOUS) ×2 IMPLANT
DRAPE U-SHAPE 47X51 STRL (DRAPES) ×3 IMPLANT
DRSG MEPILEX BORDER 4X12 (GAUZE/BANDAGES/DRESSINGS) ×3 IMPLANT
FACESHIELD WRAPAROUND (MASK) ×15 IMPLANT
FACESHIELD WRAPAROUND OR TEAM (MASK) IMPLANT
FEMORAL POSTERIOR SZ5 LFT (Femur) IMPLANT
GLOVE BIO SURGEON STRL SZ7.5 (GLOVE) ×6 IMPLANT
GLOVE BIOGEL PI IND STRL 8 (GLOVE) ×2 IMPLANT
GLOVE BIOGEL PI INDICATOR 8 (GLOVE) ×4
GOWN STRL REUS W/ TWL LRG LVL3 (GOWN DISPOSABLE) ×2 IMPLANT
GOWN STRL REUS W/TWL LRG LVL3 (GOWN DISPOSABLE) ×4
HANDPIECE INTERPULSE COAX TIP (DISPOSABLE) ×2
HOLDER FOLEY CATH W/STRAP (MISCELLANEOUS) ×2 IMPLANT
IMMOBILIZER KNEE 22 UNIV (SOFTGOODS) ×3 IMPLANT
INSERT TIB BEARING SZ6 9 (Miscellaneous) ×2 IMPLANT
KIT TURNOVER KIT A (KITS) IMPLANT
KNEE PATELLA ASYMMETRIC 10X32 (Knees) ×2 IMPLANT
KNEE TIBIAL COMPONENT SZ6 (Knees) ×2 IMPLANT
MANIFOLD NEPTUNE II (INSTRUMENTS) ×3 IMPLANT
NS IRRIG 1000ML POUR BTL (IV SOLUTION) ×3 IMPLANT
PACK TOTAL KNEE CUSTOM (KITS) ×3 IMPLANT
PIN FLUTED HEDLESS FIX 3.5X1/8 (Miscellaneous) ×2 IMPLANT
POSTERIOR FEMORAL SZ5 LFT (Femur) ×3 IMPLANT
PROTECTOR NERVE ULNAR (MISCELLANEOUS) ×3 IMPLANT
SET HNDPC FAN SPRY TIP SCT (DISPOSABLE) ×1 IMPLANT
SUT MNCRL AB 4-0 PS2 18 (SUTURE) ×3 IMPLANT
SUT VIC AB 0 CT1 36 (SUTURE) ×3 IMPLANT
SUT VIC AB 1 CT1 36 (SUTURE) ×6 IMPLANT
SUT VIC AB 2-0 CT1 27 (SUTURE) ×2
SUT VIC AB 2-0 CT1 TAPERPNT 27 (SUTURE) ×1 IMPLANT
TRAY FOLEY MTR SLVR 14FR STAT (SET/KITS/TRAYS/PACK) IMPLANT
TRAY FOLEY MTR SLVR 16FR STAT (SET/KITS/TRAYS/PACK) ×3 IMPLANT
WRAP KNEE MAXI GEL POST OP (GAUZE/BANDAGES/DRESSINGS) ×2 IMPLANT
YANKAUER SUCT BULB TIP 10FT TU (MISCELLANEOUS) ×3 IMPLANT
pin ×2 IMPLANT

## 2019-04-15 NOTE — Transfer of Care (Signed)
Immediate Anesthesia Transfer of Care Note  Patient: Thomas Wells  Procedure(s) Performed: LEFT TOTAL KNEE ARTHROPLASTY (Left Knee)  Patient Location: PACU  Anesthesia Type:Spinal  Level of Consciousness: awake, alert  and oriented  Airway & Oxygen Therapy: Patient Spontanous Breathing and Patient connected to face mask oxygen  Post-op Assessment: Report given to RN and Post -op Vital signs reviewed and stable  Post vital signs: Reviewed and stable  Last Vitals:  Vitals Value Taken Time  BP 122/59 04/15/19 1211  Temp    Pulse 75 04/15/19 1213  Resp 20 04/15/19 1213  SpO2 100 % 04/15/19 1213  Vitals shown include unvalidated device data.  Last Pain:  Vitals:   04/15/19 0910  TempSrc:   PainSc: 0-No pain      Patients Stated Pain Goal: 3 (16/01/09 3235)  Complications: No apparent anesthesia complications

## 2019-04-15 NOTE — Anesthesia Procedure Notes (Signed)
Date/Time: 04/15/2019 10:07 AM Performed by: Talbot Grumbling, CRNA Oxygen Delivery Method: Simple face mask

## 2019-04-15 NOTE — Anesthesia Procedure Notes (Signed)
Anesthesia Procedure Image    

## 2019-04-15 NOTE — Anesthesia Procedure Notes (Signed)
Spinal  Patient location during procedure: OR Start time: 04/15/2019 10:07 AM End time: 04/15/2019 10:02 AM Staffing Anesthesiologist: Myrtie Soman, MD Resident/CRNA: Talbot Grumbling, CRNA Performed: resident/CRNA  Preanesthetic Checklist Completed: patient identified, site marked, surgical consent, pre-op evaluation, timeout performed, IV checked, risks and benefits discussed and monitors and equipment checked Spinal Block Patient position: sitting Prep: DuraPrep Patient monitoring: heart rate, cardiac monitor, continuous pulse ox and blood pressure Approach: midline Location: L3-4 Injection technique: single-shot Needle Needle type: Pencan  Needle gauge: 24 G Needle length: 9 cm Assessment Sensory level: T4 Additional Notes Clear CSF, no paresthesia, patient tolerated well.

## 2019-04-15 NOTE — Interval H&P Note (Signed)
I participated in the care of this patient and agree with the above history, physical and evaluation. I performed a review of the history and a physical exam as detailed   Belva Koziel Daniel Amaury Kuzel MD  

## 2019-04-15 NOTE — Op Note (Signed)
DATE OF SURGERY:  04/15/2019 TIME: 11:35 AM  PATIENT NAME:  Thomas Wells   AGE: 68 y.o.    PRE-OPERATIVE DIAGNOSIS:  OA LEFT  KNEE  POST-OPERATIVE DIAGNOSIS:  Same  PROCEDURE:  Procedure(s): LEFT TOTAL KNEE ARTHROPLASTY   SURGEON:  Renette Butters, MD   ASSISTANT:  Roxan Hockey, PA-C, he was present and scrubbed throughout the case, critical for completion in a timely fashion, and for retraction, instrumentation, and closure.    OPERATIVE IMPLANTS: Stryker Triathlon Posterior Stabilized. Press fit knee  Femur size 5, Tibia size 6, Patella size 32 3-peg oval button, with a 9 mm polyethylene insert.   PREOPERATIVE INDICATIONS:  Thomas Wells is a 68 y.o. year old male with end stage bone on bone degenerative arthritis of the knee who failed conservative treatment, including injections, antiinflammatories, activity modification, and assistive devices, and had significant impairment of their activities of daily living, and elected for Total Knee Arthroplasty.   The risks, benefits, and alternatives were discussed at length including but not limited to the risks of infection, bleeding, nerve injury, stiffness, blood clots, the need for revision surgery, cardiopulmonary complications, among others, and they were willing to proceed.   OPERATIVE DESCRIPTION:  The patient was brought to the operative room and placed in a supine position.  General anesthesia was administered.  IV antibiotics were given.  The lower extremity was prepped and draped in the usual sterile fashion.  Time out was performed.  The leg was elevated and exsanguinated and the tourniquet was inflated.  Anterior approach was performed.  The patella was everted and osteophytes were removed.  The anterior horn of the medial and lateral meniscus was removed.   The distal femur was opened with the drill and the intramedullary distal femoral cutting jig was utilized, set at 5 degrees resecting 8 mm off the distal  femur.  Care was taken to protect the collateral ligaments.  The distal femoral sizing jig was applied, taking care to avoid notching.  Then the 4-in-1 cutting jig was applied and the anterior and posterior femur was cut, along with the chamfer cuts.  All posterior osteophytes were removed.  The flexion gap was then measured and was symmetric with the extension gap.  Then the extramedullary tibial cutting jig was utilized making the appropriate cut using the anterior tibial crest as a reference building in appropriate posterior slope.  Care was taken during the cut to protect the medial and collateral ligaments.  The proximal tibia was removed along with the posterior horns of the menisci.  The PCL was sacrificed.    The extensor gap was measured and was approximately 99mm.    I completed the distal femoral preparation using the appropriate jig to prepare the box.  The patella was then measured, and cut with the saw.    The proximal tibia sized and prepared accordingly with the reamer and the punch, and then all components were trialed with the above sized poly insert.  The knee was found to have excellent balance and full motion.    The above named components were then impacted into place and Poly tibial piece and patella were inserted.  I was very happy with his stability and ROM  I performed a periarticular injection with marcaine and toradol  The knee was easily taken through a range of motion and the patella tracked well and the knee irrigated copiously and the parapatellar and subcutaneous tissue closed with vicryl, and monocryl with steri strips for the  skin.  The incision was dressed with sterile gauze and the tourniquet released and the patient was awakened and returned to the PACU in stable and satisfactory condition.  There were no complications.  Total tourniquet time was roughly 75 minutes.   POSTOPERATIVE PLAN: post op Abx, DVT px: SCD's, TED's, Early ambulation and chemical  px

## 2019-04-15 NOTE — Discharge Instructions (Signed)

## 2019-04-15 NOTE — Anesthesia Procedure Notes (Signed)
Anesthesia Regional Block: Adductor canal block   Pre-Anesthetic Checklist: ,, timeout performed, Correct Patient, Correct Site, Correct Laterality, Correct Procedure, Correct Position, site marked, Risks and benefits discussed,  Surgical consent,  Pre-op evaluation,  At surgeon's request and post-op pain management  Laterality: Left  Prep: chloraprep       Needles:  Injection technique: Single-shot  Needle Type: Echogenic Needle     Needle Length: 9cm      Additional Needles:   Procedures:,,,, ultrasound used (permanent image in chart),,,,  Narrative:  Start time: 04/15/2019 9:00 AM End time: 04/15/2019 9:09 AM Injection made incrementally with aspirations every 5 mL.  Performed by: Personally  Anesthesiologist: Myrtie Soman, MD  Additional Notes: Patient tolerated the procedure well without complications

## 2019-04-15 NOTE — Progress Notes (Signed)
Assisted Dr. Rose with left, ultrasound guided, adductor canal block. Side rails up, monitors on throughout procedure. See vital signs in flow sheet. Tolerated Procedure well.  

## 2019-04-15 NOTE — Anesthesia Postprocedure Evaluation (Signed)
Anesthesia Post Note  Patient: BENJERMIN KORBER  Procedure(s) Performed: LEFT TOTAL KNEE ARTHROPLASTY (Left Knee)     Patient location during evaluation: PACU Anesthesia Type: Spinal Level of consciousness: oriented and awake and alert Pain management: pain level controlled Vital Signs Assessment: post-procedure vital signs reviewed and stable Respiratory status: spontaneous breathing, respiratory function stable and patient connected to nasal cannula oxygen Cardiovascular status: blood pressure returned to baseline and stable Postop Assessment: no headache, no backache and no apparent nausea or vomiting Anesthetic complications: no    Last Vitals:  Vitals:   04/15/19 1315 04/15/19 1330  BP: (!) 126/59 (!) 122/55  Pulse: 66 65  Resp: (!) 23 18  Temp:  36.5 C  SpO2: 99% 97%    Last Pain:  Vitals:   04/15/19 1330  TempSrc:   PainSc: 0-No pain                 Aasha Dina S

## 2019-04-15 NOTE — Evaluation (Signed)
Physical Therapy Evaluation Patient Details Name: Thomas Wells MRN: [id number] DOB: 07-19-1951 Today's Date: 04/15/2019   History of Present Illness  s/p L TKA. UM:PNTIRWE, hepatitis, COPD, DM  Clinical Impression  Pt is s/p TKA resulting in the deficits listed below (see PT Problem List).  Pt limited by dizziness today (likely d/t meds as VSS), completed transfer to chair with min assist +2.    Pt will benefit from skilled PT to increase their independence and safety with mobility to allow discharge to the venue listed below.      Follow Up Recommendations Follow surgeon's recommendation for DC plan and follow-up therapies    Equipment Recommendations  None recommended by PT    Recommendations for Other Services       Precautions / Restrictions Precautions Precautions: Knee Required Braces or Orthoses: Knee Immobilizer - Left Restrictions Weight Bearing Restrictions: No Other Position/Activity Restrictions: WBAT      Mobility  Bed Mobility Overal bed mobility: Needs Assistance Bed Mobility: Supine to Sit     Supine to sit: Min assist     General bed mobility comments: assist with LLE, incr time, effortful  Transfers Overall transfer level: Needs assistance Equipment used: Rolling walker (2 wheeled) Transfers: Sit to/from [facility] Sit to Stand: Min assist;+2 safety/equipment;+2 physical assistance         General transfer comment: cues for hand placement. pt mildly diaphoretic BP 164/74, HR 70s, sats 90s. amb deferred d/t dizziness--likely d/t meds, RN aware  Ambulation/Gait                Stairs            Wheelchair Mobility    Modified Rankin (Stroke Patients Only)       Balance                                             Pertinent Vitals/Pain Pain Assessment: 0-10 Pain Score: 5  Pain Location: L knee Pain Descriptors / Indicators: Burning;Sore Pain Intervention(s): Limited activity within  patient's tolerance;Monitored during session;Premedicated before session;Repositioned    Home Living Family/patient expects to be discharged to:: Private residence Living Arrangements: Spouse/significant other   Type of Home: House Home Access: Stairs to enter   [employer] of Steps: 3-4 Home Layout: One level Home Equipment: Environmental consultant - 2 wheels;Bedside commode;Cane - single point      Prior Function Level of Independence: Independent;Independent with assistive device(s)         Comments: amb with cane occasionally     Hand Dominance        Extremity/Trunk Assessment   Upper Extremity Assessment Upper Extremity Assessment: Overall WFL for tasks assessed    Lower Extremity Assessment Lower Extremity Assessment: LLE deficits/detail LLE Deficits / Details: ankle WFL. knee extension and hip flexion  2+/5       Communication   Communication: No difficulties  Cognition Arousal/Alertness: Awake/alert Behavior During Therapy: WFL for tasks assessed/performed Overall Cognitive Status: Within Functional Limits for tasks assessed                                        General Comments      Exercises Total Joint Exercises Ankle Circles/Pumps: AROM;10 reps Quad Sets: AROM;10 reps;Left   Assessment/Plan  PT Assessment Patient needs continued PT services  PT Problem List Decreased strength;Decreased range of motion;Decreased activity tolerance;Decreased mobility;Pain       PT Treatment Interventions DME instruction;Gait training;Stair training;Functional mobility training;Therapeutic activities;Patient/family education;Therapeutic exercise    PT Goals (Current goals can be found in the Care Plan section)  Acute Rehab PT Goals PT Goal Formulation: With patient Time For Goal Achievement: 04/22/19 Potential to Achieve Goals: Good    Frequency 7X/week   Barriers to discharge        Co-evaluation               AM-PAC PT "6  Clicks" Mobility  Outcome Measure Help needed turning from your back to your side while in a flat bed without using bedrails?: A Little Help needed moving from lying on your back to sitting on the side of a flat bed without using bedrails?: A Little Help needed moving to and from a bed to a chair (including a wheelchair)?: A Little Help needed standing up from a chair using your arms (e.g., wheelchair or bedside chair)?: A Little Help needed to walk in hospital room?: A Little Help needed climbing 3-5 steps with a railing? : A Lot 6 Click Score: 17    End of Session Equipment Utilized During Treatment: Gait belt Activity Tolerance: Patient tolerated treatment well Patient left: with call bell/phone within reach;in chair;with chair alarm set   PT Visit Diagnosis: Difficulty in walking, not elsewhere classified (R26.2)    Time: 5825-1898 PT Time Calculation (min) (ACUTE ONLY): 23 min   Charges:   PT Evaluation $PT Eval Low Complexity: 1 Low PT Treatments $Therapeutic Activity: 8-22 mins        Kenyon Ana, PT  Pager: [phone] Acute Rehab Dept [facility]): [phone]   04/15/2019   [facility] 04/15/2019, 5:37 PM

## 2019-04-16 LAB — GLUCOSE, CAPILLARY
Glucose-Capillary: 141 mg/dL — ABNORMAL HIGH (ref 70–99)
Glucose-Capillary: 172 mg/dL — ABNORMAL HIGH (ref 70–99)

## 2019-04-16 NOTE — Progress Notes (Signed)
Subjective: Patient reports pain as moderate.  Urinating.  No CP, SOB.   Some N/V yesterday resolved with p.o. medicine.  Patient believes this was after he received IV pain medicine.   Tolerating breakfast this morning without nausea.  Objective:   VITALS:   Vitals:   04/15/19 2104 04/15/19 2116 04/16/19 0113 04/16/19 0543  BP:  (!) 153/65 134/74 122/70  Pulse: 68 60 71 79  Resp: (!) 8 18 16 18   Temp:   (!) 97.5 F (36.4 C) (!) 97.4 F (36.3 C)  TempSrc:    Oral  SpO2: 98% 97% 99% 98%  Weight:      Height:       CBC Latest Ref Rng & Units 04/04/2019 10/16/2014 10/15/2014  WBC 4.0 - 10.5 K/uL 6.4 7.2 7.6  Hemoglobin 13.0 - 17.0 g/dL 12.8(L) 13.0 13.3  Hematocrit 39.0 - 52.0 % 39.7 40.3 39.8  Platelets 150 - 400 K/uL 184 155 161   BMP Latest Ref Rng & Units 04/04/2019 10/16/2014 10/15/2014  Glucose 70 - 99 mg/dL 92 154(H) 181(H)  BUN 8 - 23 mg/dL 24(H) 8 10  Creatinine 0.61 - 1.24 mg/dL 0.83 0.76 0.86  Sodium 135 - 145 mmol/L 138 140 140  Potassium 3.5 - 5.1 mmol/L 4.7 4.0 3.3(L)  Chloride 98 - 111 mmol/L 107 106 104  CO2 22 - 32 mmol/L 26 30 29   Calcium 8.9 - 10.3 mg/dL 9.1 8.5 8.9   Intake/Output      06/23 0701 - 06/24 0700 06/24 0701 - 06/25 0700   P.O. 480    I.V. (mL/kg) 2180.9 (17.7)    IV Piggyback 400    Total Intake(mL/kg) 3060.9 (24.8)    Urine (mL/kg/hr) 2275    Emesis/NG output 200    Stool 0    Blood 50    Total Output 2525    Net +535.9         Stool Occurrence 0 x    Emesis Occurrence 1 x       Physical Exam: General: NAD.  Upright in bed eating breakfast.  Calm, conversant. Resp: No increased wob Cardio: regular rate and rhythm ABD soft Neurologically intact MSK LLE: Neurovascularly intact Sensation intact distally Intact pulses distally Dorsiflexion/Plantar flexion intact Incision: dressing C/D/I   Assessment: 1 Day Post-Op  S/P Procedure(s) (LRB): LEFT TOTAL KNEE ARTHROPLASTY (Left) by Dr. Ernesta Amble. Percell Miller on 04/15/2019   Principal Problem:   Primary osteoarthritis of left knee Active Problems:   COPD, moderate (HCC)   Obstructive sleep apnea   DM2 (diabetes mellitus, type 2) (HCC)   HTN (hypertension)   Edema   GERD (gastroesophageal reflux disease)   Primary osteoarthritis of right knee   Primary localized osteoarthritis of knee   Primary osteoarthritis, status post left total knee arthroplasty  Doing well postop day 1  Eating, drinking, and voiding  Episode of N/V/D yesterday after meal and potentially after IV medicine.  PT deferred.  This has resolved.  Not yet mobilized   Plan: Up with therapy D/C IV fluids Incentive Spirometry Elevate and Apply ice CPM, bone foam  Weight Bearing: Weight Bearing as Tolerated (WBAT) LLE Dressings: Maintain Mepilex.  Please ensure thigh high TED hose are applied to operative leg prior to discharge. VTE prophylaxis: Aspirin, SCDs, ambulation Dispo: Planning for Home today after therapy session(s).   Anticipated LOS less than 2 midnights.  Insurance approval for inpatient due to - Age 68 and older with one or more of the following:  -  Obesity  - Expected need for hospital services (PT, OT, Nursing) required for safe  discharge   - Active co-morbidities: Diabetes and Respiratory Failure/COPD   Thomas Burly III, PA-C 04/16/2019, 8:31 AM

## 2019-04-16 NOTE — Plan of Care (Signed)
Pt ready for D\C

## 2019-04-16 NOTE — Progress Notes (Signed)
Patient vomited 2x early in shift. Resolved with PRN medication. Refused bone foam in evening.

## 2019-04-16 NOTE — Discharge Summary (Signed)
Discharge Summary  Patient ID: Thomas Wells MRN: [id number] DOB/AGE: Apr 18, 1951 68 y.o.  Admit date: 04/15/2019 Discharge date: 04/16/2019  Admission Diagnoses:  Primary osteoarthritis of left knee  Discharge Diagnoses:  Principal Problem:   Primary osteoarthritis of left knee Active Problems:   COPD, moderate (HCC)   Obstructive sleep apnea   DM2 (diabetes mellitus, type 2) (HCC)   HTN (hypertension)   Edema   GERD (gastroesophageal reflux disease)   Primary osteoarthritis of right knee   Primary localized osteoarthritis of knee   Past Medical History:  Diagnosis Date  . Arthritis    Knees, fingers  . Chronic hepatitis C ([place])    1990s had treatment  . COPD (chronic obstructive pulmonary disease) ([place])   . DM2 (diabetes mellitus, type 2) (HCC)    Dr. Jeralene Huff  . GERD (gastroesophageal reflux disease)   . Insomnia   . Lipoma   . 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 Obesity   . Pure hypercholesterolemia   . Vitamin D deficiency     Surgeries: Procedure(s): LEFT TOTAL KNEE ARTHROPLASTY on 04/15/2019   Consultants (if any):   Discharged Condition: Improved  Hospital Course: [name] is an 68 y.o. male who was admitted 04/15/2019 with a diagnosis of Primary osteoarthritis of left knee and went to the operating room on 04/15/2019 and underwent the above named procedures.    He was given perioperative antibiotics:  Anti-infectives (From admission, onward)   Start     Dose/Rate Route Frequency Ordered Stop   04/15/19 1630  ceFAZolin (ANCEF) IVPB 1 g/50 mL premix     1 g 100 mL/hr over 30 Minutes Intravenous Every 6 hours 04/15/19 1411 04/15/19 2200   04/15/19 0800  ceFAZolin (ANCEF) IVPB 2g/100 mL premix     2 g 200 mL/hr over 30 Minutes Intravenous On call to O.R. 04/15/19 0751 04/15/19 1054   04/15/19 0756  ceFAZolin (ANCEF) 2-4 GM/100ML-% IVPB    Note to Pharmacy: Kyra Leyland   : cabinet override      04/15/19 0756 04/15/19 1000     .  He was given sequential compression devices, early ambulation, and aspirin for DVT prophylaxis.  He benefited maximally from the hospital stay and there were no complications.    Recent vital signs:  Vitals:   04/16/19 0113 04/16/19 0543  BP: 134/74 122/70  Pulse: 71 79  Resp: 16 18  Temp: (!) 97.5 F (36.4 C) (!) 97.4 F (36.3 C)  SpO2: 99% 98%    Recent laboratory studies:  Lab Results  Component Value Date   HGB 12.8 (L) 04/04/2019   HGB 13.0 10/16/2014   HGB 13.3 10/15/2014   Lab Results  Component Value Date   WBC 6.4 04/04/2019   PLT 184 04/04/2019   Lab Results  Component Value Date   INR 1.15 10/15/2014   Lab Results  Component Value Date   NA 138 04/04/2019   K 4.7 04/04/2019   CL 107 04/04/2019   CO2 26 04/04/2019   BUN 24 (H) 04/04/2019   CREATININE 0.83 04/04/2019   GLUCOSE 92 04/04/2019    Discharge Medications:   Allergies as of 04/16/2019      Reactions   Hydrocodone Nausea And Vomiting   Pravastatin Nausea Only   Joint pain      Medication List    STOP taking these medications   acetaminophen 650 MG CR tablet Commonly known as: TYLENOL Replaced by: acetaminophen 500 MG tablet  ibuprofen 200 MG tablet Commonly known as: ADVIL     TAKE these medications   acetaminophen 500 MG tablet Commonly known as: TYLENOL Take 2 tablets (1,000 mg total) by mouth every 8 (eight) hours for 10 days. For Pain. Replaces: acetaminophen 650 MG CR tablet   aspirin EC 81 MG tablet Take 1 tablet (81 mg total) by mouth 2 (two) times daily. For DVT prophylaxis for 30 days after surgery. What changed:   when to take this  additional instructions   betamethasone dipropionate 0.05 % cream Commonly known as: DIPROLENE Apply 1 application topically 2 (two) times daily as needed (dry skin).   celecoxib 200 MG capsule Commonly known as: CeleBREX Take 1 capsule (200 mg total) by mouth 2 (two) times daily for 14 days. For 2 weeks post op for pain.   Discontinue Ibuprofen.   diltiazem 180 MG 24 hr capsule Commonly known as: CARDIZEM CD Take 180 mg by mouth daily.   docusate sodium 100 MG capsule Commonly known as: Colace Take 1 capsule (100 mg total) by mouth 2 (two) times daily. To prevent constipation while taking pain medication.   Fish Oil 1200 MG Caps Take 1 capsule by mouth daily.   Fluticasone-Salmeterol 250-50 MCG/DOSE Aepb Commonly known as: ADVAIR Inhale 1 puff into the lungs 2 (two) times daily as needed (asthma - Pt takes every morning).   gabapentin 300 MG capsule Commonly known as: Neurontin Take 1 capsule (300 mg total) by mouth 2 (two) times daily for 14 days. For 2 weeks post op for pain.   Garlic 3299 MG Caps Take 1,000 mg by mouth daily.   lisinopril 20 MG tablet Commonly known as: ZESTRIL Take 20 mg by mouth daily.   magnesium gluconate 500 MG tablet Commonly known as: MAGONATE Take 500 mg by mouth daily.   methocarbamol 500 MG tablet Commonly known as: Robaxin Take 1 tablet (500 mg total) by mouth every 8 (eight) hours as needed for muscle spasms.   omeprazole 20 MG capsule Commonly known as: PRILOSEC Take 20 mg by mouth daily.   ondansetron 4 MG tablet Commonly known as: Zofran Take 1 tablet (4 mg total) by mouth every 8 (eight) hours as needed for nausea or vomiting.   oxyCODONE 5 MG immediate release tablet Commonly known as: Roxicodone Take 1 tablet (5 mg total) by mouth every 4 (four) hours as needed for up to 30 days for breakthrough pain.   spironolactone 25 MG tablet Commonly known as: ALDACTONE Take 25 mg by mouth daily.   albuterol (2.5 MG/3ML) 0.083% nebulizer solution Commonly known as: PROVENTIL Take 2.5 mg by nebulization every 6 (six) hours as needed for wheezing or shortness of breath.   Ventolin HFA 108 (90 Base) MCG/ACT inhaler Generic drug: albuterol Inhale 2 puffs into the lungs every 6 (six) hours as needed for wheezing or shortness of breath.   vitamin B-12  1000 MCG tablet Commonly known as: CYANOCOBALAMIN Take 1,000 mcg by mouth daily.   Vitamin D3 25 MCG (1000 UT) Caps Take 1,000 Units by mouth daily.       Diagnostic Studies: Dg Knee Left Port  Result Date: 04/15/2019 CLINICAL DATA:  Total left knee replacement. EXAM: PORTABLE LEFT KNEE - 1-2 VIEW COMPARISON:  04/20/2015. FINDINGS: Total left knee replacement with hardware intact. Anatomic alignment. No acute bony abnormality. IMPRESSION: Total left knee replacement with anatomic alignment. Electronically Signed   By: Marcello Moores  Register   On: 04/15/2019 13:52    Disposition: Discharge disposition: 01-Home  or Self Care       Discharge Instructions    Discharge patient   Complete by: As directed    Discharge disposition: 01-Home or Self Care   Discharge patient date: 04/16/2019      Follow-up Information    Renette Butters, MD.   Specialty: Orthopedic Surgery Contact information: [address] [place] [address] [phone]            Signed: Prudencio Burly III PA-C 04/16/2019, 8:37 AM

## 2019-04-16 NOTE — Progress Notes (Signed)
Physical Therapy Treatment Patient Details Name: Thomas Wells MRN: [id number] DOB: November 20, 1950 Today's Date: 04/16/2019    History of Present Illness s/p L TKA. JO:ACZYSAY, hepatitis, COPD, DM    PT Comments    Reviewed HEP, pt able to verbalize and demonstrate appropriate mechanics, reps and proper hold times.  Gait training with RW x 125' with min cueing to improve mechanics, no LOB episodes and min guard.  Instructed step to pattern with stairs, able to verbalize and demonstrate good mechanics.  EOS pt left in chair with call bell within reach and ice applied to knee.     Follow Up Recommendations  Follow surgeon's recommendation for DC plan and follow-up therapies     Equipment Recommendations  None recommended by PT    Recommendations for Other Services       Precautions / Restrictions Precautions Precautions: Knee Required Braces or Orthoses: Knee Immobilizer - Left Restrictions Weight Bearing Restrictions: No Other Position/Activity Restrictions: WBAT    Mobility  Bed Mobility Overal bed mobility: Needs Assistance Bed Mobility: Supine to Sit     Supine to sit: Min guard     General bed mobility comments: Pt sitting in chair upon entrance  Transfers Overall transfer level: Needs assistance Equipment used: Rolling walker (2 wheeled) Transfers: Sit to/from Stand Sit to Stand: Min assist         General transfer comment: Cueing for hand placement, mild reports of dizziness through session.  BP at 134/63 mmHg  Ambulation/Gait Ambulation/Gait assistance: Min assist Gait Distance (Feet): 125 Feet Assistive device: Rolling walker (2 wheeled) Gait Pattern/deviations: Step-through pattern;Decreased stride length;Decreased stance time - left;Decreased step length - right Gait velocity: slow   General Gait Details: Stable steady mechanics, no LOB   Stairs Stairs: Yes Stairs assistance: Min guard Stair Management: Two rails Number of Stairs: 3 General  stair comments: Instructed step to pattern   Wheelchair Mobility    Modified Rankin (Stroke Patients Only)       Balance                                            Cognition Arousal/Alertness: Awake/alert Behavior During Therapy: WFL for tasks assessed/performed Overall Cognitive Status: Within Functional Limits for tasks assessed                                        Exercises Total Joint Exercises Ankle Circles/Pumps: AROM;10 reps Quad Sets: AROM;10 reps;Left(Long seated position in recliner) Short Arc Quad: Left;10 reps;Supine(Long seated position in recliner) Heel Slides: Left;10 reps;Supine(Long seated position in recliner) Hip ABduction/ADduction: Both;10 reps;Supine(Long seated position in recliner) Straight Leg Raises: (Long seated position in recliner) Long Arc Quad: Left;10 reps;Seated Goniometric ROM: Lt knee AROM 68 degrees flexion    General Comments        Pertinent Vitals/Pain Pain Assessment: 0-10 Pain Score: 2  Pain Location: L knee Pain Descriptors / Indicators: Tightness;Sore Pain Intervention(s): Premedicated before session;Monitored during session;Repositioned;Ice applied    Home Living                      Prior Function            PT Goals (current goals can now be found in the care plan section)  Frequency    7X/week      PT Plan      Co-evaluation              AM-PAC PT "6 Clicks" Mobility   Outcome Measure  Help needed turning from your back to your side while in a flat bed without using bedrails?: A Little Help needed moving from lying on your back to sitting on the side of a flat bed without using bedrails?: A Little Help needed moving to and from a bed to a chair (including a wheelchair)?: A Little Help needed standing up from a chair using your arms (e.g., wheelchair or bedside chair)?: A Little Help needed to walk in hospital room?: A Little Help needed climbing  3-5 steps with a railing? : A Lot 6 Click Score: 17    End of Session Equipment Utilized During Treatment: Gait belt Activity Tolerance: Patient tolerated treatment well Patient left: in chair;with call bell/phone within reach;with chair alarm set(Ice applied, RN aware) Nurse Communication: Mobility status PT Visit Diagnosis: Difficulty in walking, not elsewhere classified (R26.2)     Time: 6770-3403 PT Time Calculation (min) (ACUTE ONLY): 47 min  Charges:  $Gait Training: 23-37 mins $Therapeutic Exercise: 8-22 mins $Therapeutic Activity: 8-22 mins                     [address], LPTA; [place]  [name] 04/16/2019, 3:02 PM

## 2019-04-16 NOTE — Progress Notes (Signed)
Patient wearing bone foam this a.m.

## 2019-04-16 NOTE — TOC Transition Note (Signed)
Transition of Care [facility]) - CM/SW Discharge Note   Patient Details  Name: Thomas Wells MRN: [id number] Date of Birth: 11/18/1950  [facility] CM/SW Contact:  Leeroy Cha, RN Phone Number: 04/16/2019, 9:40 AM   Clinical Narrative:    dcd to home with hhc through [place] and equip   Final next level of care: [place] Barriers to Discharge: No Barriers Identified   Patient Goals and CMS Choice Patient states their goals for this hospitalization and ongoing recovery are:: to go home   Choice offered to / list presented to : Patient  Discharge Placement                       Discharge Plan and Services   Discharge Planning Services: CM Consult Post Acute Care Choice: Durable Medical Equipment, Home Health          DME Arranged: Walker rolling, 3-N-1 DME Agency: [employer] Date DME Agency Contacted: 04/16/19 Time DME Agency Contacted: 0900 Representative spoke with at DME Agency: [facility] HH Arranged: PT [place]: [facility] ([facility] [employer]) Date [place]: 04/16/19 Time [place]: 0900    Social Determinants of Health ([place]) Interventions     Readmission Risk Interventions No flowsheet data found.

## 2019-04-16 NOTE — Progress Notes (Signed)
Physical Therapy Treatment Patient Details Name: Thomas Wells MRN: [id number] DOB: 11/07/1950 Today's Date: 04/16/2019    History of Present Illness s/p L TKA. OA:CZYSAYT, hepatitis, COPD, DM    PT Comments    Began session with therex for knee mobility and strengthening, pt able to demonstrate appropriate mechanics and given HEP printout to recall.  Vitals taken WNL.  Following gait training to restroom pt c/o feeling "woozie", pt returned to sitting on EOB and vitals taken.  Increased BP to 160/53 following restroom break and reports of lightheadedness/dizziness, RN aware.  Pt left in chair with call bell within reach, chair alarm set and ice applied to knee.  Will check vitals and attempted increased distance with gait and instruct stair training for second session later today.    Follow Up Recommendations  Follow surgeon's recommendation for DC plan and follow-up therapies     Equipment Recommendations  None recommended by PT    Recommendations for Other Services       Precautions / Restrictions Precautions Precautions: Knee Required Braces or Orthoses: Knee Immobilizer - Left Restrictions Weight Bearing Restrictions: No Other Position/Activity Restrictions: WBAT    Mobility  Bed Mobility Overal bed mobility: Needs Assistance Bed Mobility: Supine to Sit     Supine to sit: Min guard     General bed mobility comments: Increased time  Transfers Overall transfer level: Needs assistance Equipment used: Rolling walker (2 wheeled) Transfers: Sit to/from Stand Sit to Stand: Min assist         General transfer comment: Cues for hand placement, reports of dizziness standing initially. vitals taken HR 76bpm, O2 at 96% room air and BP at 121/16mmHG.  Ambulation/Gait Ambulation/Gait assistance: Min assist Gait Distance (Feet): 22 Feet Assistive device: Rolling walker (2 wheeled) Gait Pattern/deviations: Step-through pattern;Decreased stride length;Decreased stance  time - left;Decreased step length - right Gait velocity: slow   General Gait Details: Slow stable gait, no LOB.  Ambulated to restroom and reports of feeling "woozie" upon return to room, requested seated rest break and vitals assessed.  Increased BP to 160/53 mmHg   Stairs             Wheelchair Mobility    Modified Rankin (Stroke Patients Only)       Balance                                            Cognition Arousal/Alertness: Awake/alert Behavior During Therapy: WFL for tasks assessed/performed Overall Cognitive Status: Within Functional Limits for tasks assessed                                        Exercises Total Joint Exercises Ankle Circles/Pumps: AROM;10 reps Quad Sets: AROM;10 reps;Left Short Arc Quad: Left;10 reps;Supine Heel Slides: Left;10 reps;Supine Hip ABduction/ADduction: Both;10 reps;Supine Straight Leg Raises: Left;10 reps;Supine Long Arc Quad: Left;10 reps;Seated Goniometric ROM: Lt knee AROM 68 degrees flexion    General Comments        Pertinent Vitals/Pain Pain Assessment: 0-10 Pain Score: 4  Pain Location: L knee Pain Descriptors / Indicators: Tightness;Sore Pain Intervention(s): Premedicated before session;Monitored during session;Repositioned;Ice applied    Home Living                      Prior Function  PT Goals (current goals can now be found in the care plan section)      Frequency    7X/week      PT Plan      Co-evaluation              AM-PAC PT "6 Clicks" Mobility   Outcome Measure  Help needed turning from your back to your side while in a flat bed without using bedrails?: A Little Help needed moving from lying on your back to sitting on the side of a flat bed without using bedrails?: A Little Help needed moving to and from a bed to a chair (including a wheelchair)?: A Little Help needed standing up from a chair using your arms (e.g.,  wheelchair or bedside chair)?: A Little Help needed to walk in hospital room?: A Little Help needed climbing 3-5 steps with a railing? : A Lot 6 Click Score: 17    End of Session Equipment Utilized During Treatment: Gait belt Activity Tolerance: Patient tolerated treatment well Patient left: in chair;with call bell/phone within reach;with chair alarm set(Ice on knee, RN aware of status and vitals) Nurse Communication: Mobility status PT Visit Diagnosis: Difficulty in walking, not elsewhere classified (R26.2)     Time: 0920-1010 PT Time Calculation (min) (ACUTE ONLY): 50 min  Charges:  $Gait Training: 8-22 mins $Therapeutic Exercise: 8-22 mins $Therapeutic Activity: 8-22 mins                    [address] LPTA; [place]   [name] 04/16/2019, 1:08 PM

## 2019-04-17 ENCOUNTER — Encounter (HOSPITAL_COMMUNITY): Payer: Self-pay | Admitting: Orthopedic Surgery

## 2019-05-16 ENCOUNTER — Other Ambulatory Visit: Payer: Self-pay

## 2019-05-16 ENCOUNTER — Ambulatory Visit (HOSPITAL_COMMUNITY)
Admission: RE | Admit: 2019-05-16 | Discharge: 2019-05-16 | Disposition: A | Discharge status: Home / Self Care | Payer: Medicare Other | Source: Ambulatory Visit | Attending: Orthopedic Surgery | Admitting: Orthopedic Surgery

## 2019-05-16 ENCOUNTER — Other Ambulatory Visit (HOSPITAL_COMMUNITY): Payer: Self-pay | Admitting: Specialist

## 2019-05-16 DIAGNOSIS — M79605 Pain in left leg: Secondary | ICD-10-CM

## 2019-05-16 DIAGNOSIS — M7989 Other specified soft tissue disorders: Secondary | ICD-10-CM

## 2019-05-16 NOTE — Progress Notes (Signed)
Left lower extremity venous duplex completed. Refer to "CV Proc" under chart review to view preliminary results.  05/16/2019 11:39 AM Maudry Mayhew, MHA, RVT, RDCS, RDMS

## 2019-08-11 DIAGNOSIS — Z96652 Presence of left artificial knee joint: Secondary | ICD-10-CM

## 2019-08-11 NOTE — H&P (Signed)
KNEE ARTHROPLASTY ADMISSION H&P  Patient ID: Thomas Wells MRN: [id number] DOB/AGE: 12-23-1950 68 y.o.  Chief Complaint: right knee pain.  Planned Procedure Date: 09/02/19 Medical Clearance by Dr. Jeralene Huff          Additional clearance by Pulmonology: Dr. Rogue Jury   HPI: Thomas Wells is a 68 y.o. male with a history of COPD, HTN, DM, LE Edema, HLD, OSA on CPAP, and GERD who presents for evaluation of OA RIGHT KNEE. The patient has a history of pain and functional disability in the right knee due to arthritis and has failed non-surgical conservative treatments for greater than 12 weeks to include NSAID's and/or analgesics, corticosteriod injections and activity modification.  Onset of symptoms was gradual, starting 5 + years ago with gradually worsening course since that time.  Patient currently rates pain at 10 out of 10 with activity. Patient has night pain, worsening of pain with activity and weight bearing and pain that interferes with activities of daily living.  Patient has evidence of subchondral cysts, subchondral sclerosis, periarticular osteophytes and joint space narrowing by imaging studies.  There is no active infection.  Past Medical History:  Diagnosis Date  . Arthritis    Knees, fingers  . Chronic hepatitis C ([place])    1990s had treatment  . COPD (chronic obstructive pulmonary disease) ([place])   . DM2 (diabetes mellitus, type 2) ([facility])    Dr. Jeralene Huff  . GERD (gastroesophageal reflux disease)   . Insomnia   . Lipoma   . 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 Obesity   . Pure hypercholesterolemia   . Vitamin D deficiency    Past Surgical History:  Procedure Laterality Date  . BACK SURGERY  1989   L3, fused L2 and L4  . FINGER AMPUTATION  10/1968  . TOTAL KNEE ARTHROPLASTY Left 04/15/2019   Procedure: LEFT TOTAL KNEE ARTHROPLASTY;  Surgeon: Renette Butters, MD;  Location: [facility];  Service: Orthopedics;  Laterality: Left;   Allergies  Allergen Reactions   . Hydrocodone Nausea And Vomiting  . Pravastatin Nausea Only    Joint pain   Medications: Omeprazole 40 mg daily. Diltiazem ER 180 mg daily. Lisinopril 20 mg daily Spironolactone 25 mg daily. Advair discus 250/50 daily Albuterol as needed  Social history: Retired former smoker - 2 to 3 packs/day for 30+ years.  Quit 1997.  No alcohol use.  Family History  Problem Relation Age of Onset  . Heart disease Father   . Brain cancer Mother     ROS: Currently denies lightheadedness, dizziness, Fever, chills, CP, SOB.   No personal history of DVT, PE, MI, or CVA. No loose teeth.  He has dentures.  He wears glasses. All other systems have been reviewed and were otherwise currently negative with the exception of those mentioned in the HPI and as above.  Objective: Vitals: Ht: 5"10" Wt: 270 Temp: 98.0 BP: 158/68 Pulse: 69 O2 96% on room air.   Physical Exam: General: Alert, NAD.  Antalgic Gait  HEENT: EOMI, Good Neck Extension  Pulm: No increased work of breathing.  Clear B/L A/P w/o crackle or wheeze.  CV: RRR, No m/g/r appreciated  GI: soft, NT, ND Neuro: Neuro without gross focal deficit.  Sensation intact distally Skin: No lesions in the area of chief complaint MSK/Surgical Site: Right knee w/o redness or effusion.  Mostly medial JLT. ROM 5-115.  5/5 strength in extension and flexion.  +EHL/FHL.  NVI.  Stable varus and valgus  stress.    Imaging Review Plain radiographs demonstrate severe degenerative joint disease of the right knee.   Preoperative templating of the joint replacement has been completed, documented, and submitted to the Operating Room personnel in order to optimize intra-operative equipment management.  Assessment: OA RIGHT KNEE Principal Problem:   Primary osteoarthritis of right knee Active Problems:   COPD, moderate (HCC)   Obstructive sleep apnea   DM2 (diabetes mellitus, type 2) (HCC)   HTN (hypertension)   Edema   GERD (gastroesophageal reflux  disease)   History of total left knee replacement   Plan: Plan for Procedure(s): TOTAL KNEE ARTHROPLASTY  The patient history, physical exam, clinical judgement of the provider and imaging are consistent with end stage degenerative joint disease and Total joint arthroplasty is deemed medically necessary. The treatment options including medical management, injection therapy, and arthroplasty were discussed at length. The risks and benefits of Procedure(s): TOTAL KNEE ARTHROPLASTY were presented and reviewed.  The risks of nonoperative treatment, versus surgical intervention including but not limited to continued pain, aseptic loosening, stiffness, dislocation/subluxation, infection, bleeding, nerve injury, blood clots, cardiopulmonary complications, morbidity, mortality, among others were discussed. The patient verbalizes understanding and wishes to proceed with the plan.  Patient is being admitted for inpatient treatment for surgery, pain control, PT, prophylactic antibiotics, VTE prophylaxis, progressive ambulation, ADL's and discharge planning.   Dental prophylaxis discussed and recommended for 2 years postoperatively.   The patient does meet the criteria for TXA which will be used perioperatively.    ASA 81 mg BID will be used postoperatively for DVT prophylaxis in addition to SCDs, and early ambulation.  Plan for Tylenol, Gabapentin, Celebrex, oxycodone for pain.  Continue omeprazole for reflux / gastric protection.  The patient is planning to be discharged home with home health services ([facility]) in care of his wife.   Anticipated LOS less than 2 midnights.  Insurance approval for inpatient due to: - Age 27 and older with one or more of the following:             - Obesity             - Expected need for hospital services (PT, OT, Nursing) required for safe discharge             - Anticipated need for postoperative skilled nursing care or inpatient rehab             - Active  co-morbidities: Diabetes  Prudencio Burly III, PA-C 08/11/2019 8:34 AM

## 2019-08-20 NOTE — Progress Notes (Signed)
PCP - Jefm Petty LOV  04-09-19   On chart Cardiologist -   Chest x-ray -  EKG - 04-04-19 epic , ekg 04-09-19 PCP note left knee surgery 04-15-19  Stress Test -  ECHO -  Cardiac Cath -   Sleep Study -  CPAP -   Fasting Blood Sugar -  Checks Blood Sugar _____ times a day  Blood Thinner Instructions: Aspirin Instructions: Last Dose:  Anesthesia review: OSA ,COPD, Hepatitis 1990 had treatment,   Patient denies shortness of breath, fever, cough and chest pain at PAT appointment   NONE   Patient verbalized understanding of instructions that were given to them at the PAT appointment. Patient was also instructed that they will need to review over the PAT instructions again at home before surgery. Note

## 2019-08-20 NOTE — Patient Instructions (Addendum)
DUE TO COVID-19 ONLY ONE VISITOR IS ALLOWED TO COME WITH YOU AND STAY IN THE WAITING ROOM ONLY DURING PRE OP AND PROCEDURE DAY OF SURGERY. THE 1 VISITOR MAY VISIT WITH YOU AFTER SURGERY IN YOUR PRIVATE ROOM DURING VISITING HOURS ONLY!  YOU NEED TO HAVE A COVID 19 TEST ON_______ @_______ , THIS TEST MUST BE DONE BEFORE SURGERY, COME  [place], Thomas Wells , [id number].  ([place]) ONCE YOUR COVID TEST IS COMPLETED, PLEASE BEGIN THE QUARANTINE INSTRUCTIONS AS OUTLINED IN YOUR HANDOUT.                Thomas Wells  08/20/2019   Your procedure is scheduled on: 11-10 20   Report to [facility]  Entrance   Report to admitting at     [place] AM     Call this number if you have problems the morning of surgery [phone]    Remember: NO SOLID FOOD AFTER MIDNIGHT THE NIGHT PRIOR TO SURGERY. NOTHING BY MOUTH EXCEPT CLEAR LIQUIDS UNTIL   0655 am  . PLEASE FINISH ENSURE DRINK PER SURGEON ORDER  WHICH NEEDS TO BE COMPLETED AT         [id number] am then nothing by mouth .    CLEAR LIQUID DIET   Foods Allowed                                                                     Foods Excluded  Coffee and tea, regular and decaf                             liquids that you cannot  Plain Jell-O any favor except red or purple                                           see through such as: Fruit ices (not with fruit pulp)                                     milk, soups, orange juice  Iced Popsicles                                    All solid food Carbonated beverages, regular and diet                                    Cranberry, grape and apple juices Sports drinks like Gatorade Lightly seasoned clear broth or consume(fat free) Sugar, honey syrup   _____________________________________________________________________     BRUSH YOUR TEETH MORNING OF SURGERY AND RINSE YOUR MOUTH OUT, NO CHEWING GUM CANDY OR MINTS.     Take these medicines the morning of surgery  with A SIP OF WATER: omeprazole, dilitiazem, inhalers bring with you, tylenol if needed and nebulizer use at home if needed  DO NOT [place]  OF YOUR SURGERY                               You may not have any metal on your body including hair pins and              piercings  Do not wear jewelry,  lotions, powders or perfumes, deodorant                       Men may shave face and neck.   Do not bring valuables to the hospital. [place].  Contacts, dentures or bridgework may not be worn into surgery.   Special Instructions: N/A              Please read over the following fact sheets you were given: _____________________________________________________________________          [facility] - Preparing for Surgery Before surgery, you can play an important role.  Because skin is not sterile, your skin needs to be as free of germs as possible.  You can reduce the number of germs on your skin by washing with CHG (chlorahexidine gluconate) soap before surgery.  CHG is an antiseptic cleaner which kills germs and bonds with the skin to continue killing germs even after washing. Please DO NOT use if you have an allergy to CHG or antibacterial soaps.  If your skin becomes reddened/irritated stop using the CHG and inform your nurse when you arrive at Short Stay. Do not shave (including legs and underarms) for at least 48 hours prior to the first CHG shower.  You may shave your face/neck. Please follow these instructions carefully:  1.  Shower with CHG Soap the night before surgery and the  morning of Surgery.  2.  If you choose to wash your hair, wash your hair first as usual with your  normal  shampoo.  3.  After you shampoo, rinse your hair and body thoroughly to remove the  shampoo.                           4.  Use CHG as you would any other liquid soap.  You can apply chg directly  to the skin and wash                        Gently with a scrungie or clean washcloth.  5.  Apply the CHG Soap to your body ONLY FROM THE NECK DOWN.   Do not use on face/ open                           Wound or open sores. Avoid contact with eyes, ears mouth and genitals (private parts).                       Wash face,  Genitals (private parts) with your normal soap.             6.  Wash thoroughly, paying special attention to the area where your surgery  will be performed.  7.  Thoroughly rinse your body with warm water from the neck down.  8.  DO NOT shower/wash with your normal soap after using and rinsing  off  the CHG Soap.                9.  Pat yourself dry with a clean towel.            10.  Wear clean pajamas.            11.  Place clean sheets on your bed the night of your first shower and do not  sleep with pets. Day of Surgery : Do not apply any lotions/deodorants the morning of surgery.  Please wear clean clothes to the hospital/surgery center.  FAILURE TO FOLLOW THESE INSTRUCTIONS MAY RESULT IN THE CANCELLATION OF YOUR SURGERY PATIENT SIGNATURE_________________________________  NURSE SIGNATURE__________________________________  ________________________________________________________________________   Thomas Wells  An incentive spirometer is a tool that can help keep your lungs clear and active. This tool measures how well you are filling your lungs with each breath. Taking long deep breaths may help reverse or decrease the chance of developing breathing (pulmonary) problems (especially infection) following:  A long period of time when you are unable to move or be active. BEFORE THE PROCEDURE   If the spirometer includes an indicator to show your best effort, your nurse or respiratory therapist will set it to a desired goal.  If possible, sit up straight or lean slightly forward. Try not to slouch.  Hold the incentive spirometer in an upright position. INSTRUCTIONS FOR USE  1. Sit on the edge of your bed  if possible, or sit up as far as you can in bed or on a chair. 2. Hold the incentive spirometer in an upright position. 3. Breathe out normally. 4. Place the mouthpiece in your mouth and seal your lips tightly around it. 5. Breathe in slowly and as deeply as possible, raising the piston or the ball toward the top of the column. 6. Hold your breath for 3-5 seconds or for as long as possible. Allow the piston or ball to fall to the bottom of the column. 7. Remove the mouthpiece from your mouth and breathe out normally. 8. Rest for a few seconds and repeat Steps 1 through 7 at least 10 times every 1-2 hours when you are awake. Take your time and take a few normal breaths between deep breaths. 9. The spirometer may include an indicator to show your best effort. Use the indicator as a goal to work toward during each repetition. 10. After each set of 10 deep breaths, practice coughing to be sure your lungs are clear. If you have an incision (the cut made at the time of surgery), support your incision when coughing by placing a pillow or rolled up towels firmly against it. Once you are able to get out of bed, walk around indoors and cough well. You may stop using the incentive spirometer when instructed by your caregiver.  RISKS AND COMPLICATIONS  Take your time so you do not get dizzy or light-headed.  If you are in pain, you may need to take or ask for pain medication before doing incentive spirometry. It is harder to take a deep breath if you are having pain. AFTER USE  Rest and breathe slowly and easily.  It can be helpful to keep track of a log of your progress. Your caregiver can provide you with a simple table to help with this. If you are using the spirometer at home, follow these instructions: [place] IF:   You are having difficultly using the spirometer.  You have trouble using the spirometer  as often as instructed.  Your pain medication is not giving enough relief while  using the spirometer.  You develop fever of 100.5 F (38.1 C) or higher. SEEK IMMEDIATE MEDICAL CARE IF:   You cough up bloody sputum that had not been present before.  You develop fever of 102 F (38.9 C) or greater.  You develop worsening pain at or near the incision site. MAKE SURE YOU:   Understand these instructions.  Will watch your condition.  Will get help right away if you are not doing well or get worse. Document Released: 02/19/2007 Document Revised: 01/01/2012 Document Reviewed: 04/22/2007 [facility] Patient Information 2014 [place], [place].   ________________________________________________________________________

## 2019-08-26 ENCOUNTER — Encounter (HOSPITAL_COMMUNITY): Payer: Self-pay

## 2019-08-26 ENCOUNTER — Other Ambulatory Visit: Payer: Self-pay

## 2019-08-26 ENCOUNTER — Encounter (INDEPENDENT_AMBULATORY_CARE_PROVIDER_SITE_OTHER): Payer: Self-pay

## 2019-08-26 ENCOUNTER — Encounter (HOSPITAL_COMMUNITY)
Admission: RE | Admit: 2019-08-26 | Discharge: 2019-08-26 | Disposition: A | Discharge status: Home / Self Care | Payer: Medicare Other | Source: Ambulatory Visit | Attending: Orthopedic Surgery | Admitting: Orthopedic Surgery

## 2019-08-26 DIAGNOSIS — M1711 Unilateral primary osteoarthritis, right knee: Secondary | ICD-10-CM | POA: Insufficient documentation

## 2019-08-26 DIAGNOSIS — Z01812 Encounter for preprocedural laboratory examination: Secondary | ICD-10-CM | POA: Insufficient documentation

## 2019-08-26 HISTORY — DX: Nausea with vomiting, unspecified: R11.2

## 2019-08-26 HISTORY — DX: Nausea with vomiting, unspecified: Z98.890

## 2019-08-26 HISTORY — DX: Pneumonia, unspecified organism: J18.9

## 2019-08-26 HISTORY — DX: Sleep apnea, unspecified: G47.30

## 2019-08-26 LAB — HEMOGLOBIN A1C
Hgb A1c MFr Bld: 5.7 % — ABNORMAL HIGH (ref 4.8–5.6)
Mean Plasma Glucose: 116.89 mg/dL

## 2019-08-26 LAB — BASIC METABOLIC PANEL
Anion gap: 10 (ref 5–15)
BUN: 20 mg/dL (ref 8–23)
CO2: 23 mmol/L (ref 22–32)
Calcium: 9.3 mg/dL (ref 8.9–10.3)
Chloride: 106 mmol/L (ref 98–111)
Creatinine, Ser: 0.94 mg/dL (ref 0.61–1.24)
GFR calc Af Amer: >60 mL/min (ref >60)
GFR calc non Af Amer: >60 mL/min (ref >60)
Glucose, Bld: 136 mg/dL — ABNORMAL HIGH (ref 70–99)
Potassium: 4.2 mmol/L (ref 3.5–5.1)
Sodium: 139 mmol/L (ref 135–145)

## 2019-08-26 LAB — SURGICAL PCR SCREEN
MRSA, PCR: NEGATIVE
Staphylococcus aureus: NEGATIVE

## 2019-08-26 LAB — CBC
HCT: 41.8 % (ref 39.0–52.0)
Hemoglobin: 13.9 g/dL (ref 13.0–17.0)
MCH: 33 pg (ref 26.0–34.0)
MCHC: 33.3 g/dL (ref 30.0–36.0)
MCV: 99.3 fL (ref 80.0–100.0)
Platelets: 200 10*3/uL (ref 150–400)
RBC: 4.21 MIL/uL — ABNORMAL LOW (ref 4.22–5.81)
RDW: 12.7 % (ref 11.5–15.5)
WBC: 8.3 10*3/uL (ref 4.0–10.5)
nRBC: 0 % (ref 0.0–0.2)

## 2019-08-26 LAB — GLUCOSE, CAPILLARY: Glucose-Capillary: 157 mg/dL — ABNORMAL HIGH (ref 70–99)

## 2019-08-26 NOTE — Progress Notes (Signed)
ekg requested from Dr. Jeralene Huff office that was done 04-20-19 care everywhere.

## 2019-08-29 ENCOUNTER — Other Ambulatory Visit (HOSPITAL_COMMUNITY)
Admission: RE | Admit: 2019-08-29 | Discharge: 2019-08-29 | Disposition: A | Discharge status: Home / Self Care | Payer: Medicare Other | Source: Ambulatory Visit | Attending: Orthopedic Surgery | Admitting: Orthopedic Surgery

## 2019-08-29 DIAGNOSIS — Z20828 Contact with and (suspected) exposure to other viral communicable diseases: Secondary | ICD-10-CM | POA: Insufficient documentation

## 2019-08-29 DIAGNOSIS — Z01812 Encounter for preprocedural laboratory examination: Secondary | ICD-10-CM | POA: Diagnosis present

## 2019-08-30 LAB — NOVEL CORONAVIRUS, NAA (HOSP ORDER, SEND-OUT TO REF LAB; TAT 18-24 HRS): SARS-CoV-2, NAA: NOT DETECTED

## 2019-09-01 MED ORDER — DEXTROSE 5 % IV SOLN
3.0000 g | INTRAVENOUS | Status: AC
  Administered 2019-09-02: 3 g via INTRAVENOUS
  Filled 2019-09-01: qty 3

## 2019-09-01 MED ORDER — BUPIVACAINE LIPOSOME 1.3 % IJ SUSP
20.0000 mL | Freq: Once | INTRAMUSCULAR | Status: DC
  Filled 2019-09-01 (×2): qty 20

## 2019-09-01 NOTE — Anesthesia Preprocedure Evaluation (Addendum)
Anesthesia Evaluation  Patient identified by MRN, date of birth, ID band Patient awake    Reviewed: Allergy & Precautions, NPO status , Patient's Chart, lab work & pertinent test results  History of Anesthesia Complications (+) PONV and history of anesthetic complications  Airway Mallampati: II  TM Distance: >3 FB Neck ROM: Full    Dental  (+) Edentulous Lower, Edentulous Upper   Pulmonary sleep apnea , COPD,  COPD inhaler, former smoker,    Pulmonary exam normal        Cardiovascular hypertension, Pt. on medications Normal cardiovascular exam     Neuro/Psych negative neurological ROS  negative psych ROS   GI/Hepatic GERD  Medicated,(+) Hepatitis -, C  Endo/Other  diabetes, Type 2 Obesity   Renal/GU negative Renal ROS     Musculoskeletal  (+) Arthritis ,   Abdominal   Peds  Hematology negative hematology ROS (+)   Anesthesia Other Findings   Reproductive/Obstetrics                            Anesthesia Physical Anesthesia Plan  ASA: III  Anesthesia Plan: Spinal   Post-op Pain Management:  [facility] for Post-op pain   Induction:   PONV Risk Score and Plan: 2 and Treatment may vary due to age or medical condition and Propofol infusion  Airway Management Planned: Natural Airway and Simple Face Mask  Additional Equipment: None  Intra-op Plan:   Post-operative Plan:   Informed Consent: I have reviewed the patients History and Physical, chart, labs and discussed the procedure including the risks, benefits and alternatives for the proposed anesthesia with the patient or authorized representative who has indicated his/her understanding and acceptance.       Plan Discussed with: CRNA and Anesthesiologist  Anesthesia Plan Comments:        Anesthesia Quick Evaluation

## 2019-09-01 NOTE — Progress Notes (Signed)
Anesthesia Chart Review   Case: [id number] Date/Time: 09/02/19 0940   Procedure: TOTAL KNEE ARTHROPLASTY (Right Knee)   Anesthesia type: Choice   Pre-op diagnosis: OA RIGHT KNEE   Location: [facility] ROOM 08 / [facility]   Surgeon: Renette Butters, MD      DISCUSSION:68 yo former smoker (75 pack years, quit 10/24/95) with h/o COPD, GERD, DM II, Chronic Hepatitis C (treated)right knee OA scheduled for above procedure 09/02/2019 with Dr. Edmonia Lynch.   S/p left knee arthroplasty 04/15/2019 with no anesthesia complications noted.   Prior to 04/15/2019 surgery he was cleared by PCP and pulmonologist.  Stable since that time.    Pt cleared by PCP, Dr. Jefm Petty, "cleared from every perspective except pulmonary since he has severe COPD diagnosis."  Per Pulmonlogist, Dr. Camillo Flaming, on 03/20/2019 "Okay from pulmonary standpoint for surgical clearance."  EKG at PAT 04/04/2019 with note from Dr. Thompson Grayer stating ST elevation, consider early repolarization, pericarditis, or injury, new from prior ekg.  Clinical correlation is advised.  Pt asymptomatic at PAT visit.  EKG forwarded to PCP for review.  Pt seen in office by PCP on 04/09/2019.  Per Dr. Jeralene Huff note, "Repeat EKG shows no ST changes and he is asymptomatic.  Suspect ekg changes seen on cardiogram from [place] represented vacillating baseline and that worst mild early repolarization.  Will contact Dr. Percell Miller with this information to let him know that is does not appear anything is actively going on."  Pt can proceed with planned procedure barring acute status change.   VS: BP (!) 150/57   Pulse 69   Temp 36.8 C (Oral)   Resp 16   Ht 5\' 10"  (1.778 m)   Wt 122 kg   SpO2 97%   BMI 38.60 kg/m   PROVIDERS: Jefm Petty, MD is PCP   Camillo Flaming, MD is Pulmonologist  LABS: Labs reviewed: Acceptable for surgery. (all labs ordered are listed, but only abnormal results are displayed)  Labs Reviewed  BASIC METABOLIC PANEL - Abnormal; Notable for  the following components:      Result Value   Glucose, Bld 136 (*)    All other components within normal limits  CBC - Abnormal; Notable for the following components:   RBC 4.21 (*)    All other components within normal limits  HEMOGLOBIN A1C - Abnormal; Notable for the following components:   Hgb A1c MFr Bld 5.7 (*)    All other components within normal limits  GLUCOSE, CAPILLARY - Abnormal; Notable for the following components:   Glucose-Capillary 157 (*)    All other components within normal limits  SURGICAL PCR SCREEN     IMAGES:   EKG: 04/20/2019 Rate 68 bpm Sinus rhythm First degree A-V block Otherwise normal ECG No previous ECGs available  CV:  Past Medical History:  Diagnosis Date  . Arthritis    Knees, fingers  . Chronic hepatitis C ([place])    1990s had treatment  . COPD (chronic obstructive pulmonary disease) ([place])    emphysema ([place]) [facility]  . DM2 (diabetes mellitus, type 2) ([place])    Dr. Jeralene Huff  . GERD (gastroesophageal reflux disease)   . Insomnia   . Lipoma   . 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 Obesity   . Pneumonia   . PONV (postoperative nausea and vomiting)    post op after dilaudid shot   . Pure hypercholesterolemia   . Sleep apnea    cpap  .  Vitamin D deficiency     Past Surgical History:  Procedure Laterality Date  . BACK SURGERY  1989   L3, fused L2 and L4  . FINGER AMPUTATION  10/1968  . SHOULDER SURGERY     rotator cuff surgery  left shoulder  . TOTAL KNEE ARTHROPLASTY Left 04/15/2019   Procedure: LEFT TOTAL KNEE ARTHROPLASTY;  Surgeon: Renette Butters, MD;  Location: [facility];  Service: Orthopedics;  Laterality: Left;    MEDICATIONS: . acetaminophen (TYLENOL) 500 MG tablet  . albuterol (PROVENTIL) (2.5 MG/3ML) 0.083% nebulizer solution  . albuterol (VENTOLIN HFA) 108 (90 BASE) MCG/ACT inhaler  . aspirin EC 81 MG tablet  . betamethasone dipropionate (DIPROLENE) 0.05 % cream  .  Cholecalciferol (VITAMIN D3) 25 MCG (1000 UT) CAPS  . Cyanocobalamin (B-12) 2500 MCG TABS  . diltiazem (CARDIZEM CD) 180 MG 24 hr capsule  . Fluticasone-Salmeterol (ADVAIR) 250-50 MCG/DOSE AEPB  . ibuprofen (ADVIL) 200 MG tablet  . lisinopril (ZESTRIL) 20 MG tablet  . magnesium gluconate (MAGONATE) 500 MG tablet  . omeprazole (PRILOSEC) 40 MG capsule  . spironolactone (ALDACTONE) 25 MG tablet   No current facility-administered medications for this encounter.    Derrill Memo ON 09/02/2019] bupivacaine liposome (EXPAREL) 1.3 % injection 266 mg  . [START ON 09/02/2019] ceFAZolin (ANCEF) 3 g in dextrose 5 % 50 mL IVPB     Maia Plan [facility] Pre-Surgical Testing [phone] 09/01/19  9:07 AM

## 2019-09-02 ENCOUNTER — Observation Stay (HOSPITAL_COMMUNITY): Payer: Medicare Other

## 2019-09-02 ENCOUNTER — Inpatient Hospital Stay (HOSPITAL_COMMUNITY): Payer: Medicare Other | Admitting: Physician Assistant

## 2019-09-02 ENCOUNTER — Inpatient Hospital Stay (HOSPITAL_COMMUNITY): Payer: Medicare Other

## 2019-09-02 ENCOUNTER — Inpatient Hospital Stay (HOSPITAL_COMMUNITY): Payer: Medicare Other | Admitting: Anesthesiology

## 2019-09-02 ENCOUNTER — Other Ambulatory Visit: Payer: Self-pay

## 2019-09-02 ENCOUNTER — Encounter (HOSPITAL_COMMUNITY)
Admission: RE | Disposition: A | Discharge status: Home / Self Care | Payer: Self-pay | Source: Home / Self Care | Attending: Orthopedic Surgery

## 2019-09-02 ENCOUNTER — Encounter (HOSPITAL_COMMUNITY): Payer: Self-pay

## 2019-09-02 ENCOUNTER — Inpatient Hospital Stay (HOSPITAL_COMMUNITY)
Admission: RE | Admit: 2019-09-02 | Discharge: 2019-09-04 | DRG: 470 | Disposition: A | Discharge status: Home / Self Care | Payer: Medicare Other | Attending: Orthopedic Surgery | Admitting: Orthopedic Surgery

## 2019-09-02 DIAGNOSIS — Z7951 Long term (current) use of inhaled steroids: Secondary | ICD-10-CM | POA: Diagnosis not present

## 2019-09-02 DIAGNOSIS — K219 Gastro-esophageal reflux disease without esophagitis: Secondary | ICD-10-CM | POA: Diagnosis present

## 2019-09-02 DIAGNOSIS — M1710 Unilateral primary osteoarthritis, unspecified knee: Secondary | ICD-10-CM | POA: Diagnosis present

## 2019-09-02 DIAGNOSIS — R609 Edema, unspecified: Secondary | ICD-10-CM | POA: Diagnosis present

## 2019-09-02 DIAGNOSIS — E785 Hyperlipidemia, unspecified: Secondary | ICD-10-CM | POA: Diagnosis present

## 2019-09-02 DIAGNOSIS — Z79899 Other long term (current) drug therapy: Secondary | ICD-10-CM | POA: Diagnosis not present

## 2019-09-02 DIAGNOSIS — G4733 Obstructive sleep apnea (adult) (pediatric): Secondary | ICD-10-CM | POA: Diagnosis present

## 2019-09-02 DIAGNOSIS — B182 Chronic viral hepatitis C: Secondary | ICD-10-CM | POA: Diagnosis present

## 2019-09-02 DIAGNOSIS — M1711 Unilateral primary osteoarthritis, right knee: Secondary | ICD-10-CM | POA: Diagnosis present

## 2019-09-02 DIAGNOSIS — E669 Obesity, unspecified: Secondary | ICD-10-CM | POA: Diagnosis present

## 2019-09-02 DIAGNOSIS — I10 Essential (primary) hypertension: Secondary | ICD-10-CM | POA: Diagnosis present

## 2019-09-02 DIAGNOSIS — R339 Retention of urine, unspecified: Secondary | ICD-10-CM | POA: Diagnosis not present

## 2019-09-02 DIAGNOSIS — Z96659 Presence of unspecified artificial knee joint: Secondary | ICD-10-CM

## 2019-09-02 DIAGNOSIS — Z888 Allergy status to other drugs, medicaments and biological substances status: Secondary | ICD-10-CM | POA: Diagnosis not present

## 2019-09-02 DIAGNOSIS — Z6838 Body mass index (BMI) 38.0-38.9, adult: Secondary | ICD-10-CM

## 2019-09-02 DIAGNOSIS — Z96652 Presence of left artificial knee joint: Secondary | ICD-10-CM | POA: Diagnosis present

## 2019-09-02 DIAGNOSIS — Z8249 Family history of ischemic heart disease and other diseases of the circulatory system: Secondary | ICD-10-CM

## 2019-09-02 DIAGNOSIS — J449 Chronic obstructive pulmonary disease, unspecified: Secondary | ICD-10-CM | POA: Diagnosis present

## 2019-09-02 DIAGNOSIS — E7800 Pure hypercholesterolemia, unspecified: Secondary | ICD-10-CM | POA: Diagnosis present

## 2019-09-02 DIAGNOSIS — Z885 Allergy status to narcotic agent status: Secondary | ICD-10-CM | POA: Diagnosis not present

## 2019-09-02 DIAGNOSIS — E119 Type 2 diabetes mellitus without complications: Secondary | ICD-10-CM | POA: Diagnosis present

## 2019-09-02 DIAGNOSIS — Z808 Family history of malignant neoplasm of other organs or systems: Secondary | ICD-10-CM | POA: Diagnosis not present

## 2019-09-02 DIAGNOSIS — Z89029 Acquired absence of unspecified finger(s): Secondary | ICD-10-CM

## 2019-09-02 DIAGNOSIS — Z87891 Personal history of nicotine dependence: Secondary | ICD-10-CM | POA: Diagnosis not present

## 2019-09-02 DIAGNOSIS — J439 Emphysema, unspecified: Secondary | ICD-10-CM | POA: Diagnosis present

## 2019-09-02 HISTORY — PX: TOTAL KNEE ARTHROPLASTY: SHX125

## 2019-09-02 LAB — GLUCOSE, CAPILLARY
Glucose-Capillary: 120 mg/dL — ABNORMAL HIGH (ref 70–99)
Glucose-Capillary: 109 mg/dL — ABNORMAL HIGH (ref 70–99)

## 2019-09-02 SURGERY — ARTHROPLASTY, KNEE, TOTAL
Anesthesia: Spinal | Site: Knee | Laterality: Right

## 2019-09-02 MED ORDER — DEXAMETHASONE SODIUM PHOSPHATE 10 MG/ML IJ SOLN
INTRAMUSCULAR | Status: DC | PRN
  Administered 2019-09-02: 4 mg via INTRAVENOUS

## 2019-09-02 MED ORDER — DIPHENHYDRAMINE HCL 12.5 MG/5ML PO ELIX: 12.5000 mg | ORAL_SOLUTION | ORAL | Status: DC | PRN

## 2019-09-02 MED ORDER — 0.9 % SODIUM CHLORIDE (POUR BTL) OPTIME
TOPICAL | Status: DC | PRN
  Administered 2019-09-02: 1000 mL

## 2019-09-02 MED ORDER — CEFAZOLIN SODIUM-DEXTROSE 1-4 GM/50ML-% IV SOLN
1.0000 g | Freq: Four times a day (QID) | INTRAVENOUS | Status: AC
  Administered 2019-09-02 (×2): 1 g via INTRAVENOUS
  Filled 2019-09-02 (×2): qty 50

## 2019-09-02 MED ORDER — ACETAMINOPHEN 500 MG PO TABS: 1000.0000 mg | ORAL_TABLET | Freq: Three times a day (TID) | ORAL | 0 refills | Status: AC

## 2019-09-02 MED ORDER — PROPOFOL 500 MG/50ML IV EMUL
INTRAVENOUS | Status: AC
  Filled 2019-09-02: qty 50

## 2019-09-02 MED ORDER — SODIUM CHLORIDE 0.9 % IR SOLN
Status: DC | PRN
  Administered 2019-09-02: 1000 mL

## 2019-09-02 MED ORDER — PROPOFOL 10 MG/ML IV BOLUS
INTRAVENOUS | Status: AC
  Filled 2019-09-02: qty 20

## 2019-09-02 MED ORDER — ONDANSETRON HCL 4 MG PO TABS: 4.0000 mg | ORAL_TABLET | Freq: Three times a day (TID) | ORAL | 0 refills | Status: AC | PRN

## 2019-09-02 MED ORDER — ASPIRIN EC 81 MG PO TBEC: 81.0000 mg | DELAYED_RELEASE_TABLET | Freq: Two times a day (BID) | ORAL | 0 refills | Status: AC

## 2019-09-02 MED ORDER — PANTOPRAZOLE SODIUM 40 MG PO TBEC
40.0000 mg | DELAYED_RELEASE_TABLET | Freq: Every day | ORAL | Status: DC
  Administered 2019-09-03 – 2019-09-04 (×2): 40 mg via ORAL
  Filled 2019-09-02 (×2): qty 1

## 2019-09-02 MED ORDER — FENTANYL CITRATE (PF) 100 MCG/2ML IJ SOLN
50.0000 ug | INTRAMUSCULAR | Status: DC
  Administered 2019-09-02: 50 ug via INTRAVENOUS
  Filled 2019-09-02: qty 2

## 2019-09-02 MED ORDER — OXYCODONE HCL 5 MG PO TABS: 5.0000 mg | ORAL_TABLET | Freq: Once | ORAL | Status: DC | PRN

## 2019-09-02 MED ORDER — PROPOFOL 10 MG/ML IV BOLUS
INTRAVENOUS | Status: DC | PRN
  Administered 2019-09-02: 20 mg via INTRAVENOUS
  Administered 2019-09-02: 10 mg via INTRAVENOUS
  Administered 2019-09-02 (×6): 20 mg via INTRAVENOUS

## 2019-09-02 MED ORDER — METOCLOPRAMIDE HCL 5 MG/ML IJ SOLN: 5.0000 mg | Freq: Three times a day (TID) | INTRAMUSCULAR | Status: DC | PRN

## 2019-09-02 MED ORDER — ALBUTEROL SULFATE HFA 108 (90 BASE) MCG/ACT IN AERS: 2.0000 | INHALATION_SPRAY | Freq: Four times a day (QID) | RESPIRATORY_TRACT | Status: DC | PRN

## 2019-09-02 MED ORDER — METOCLOPRAMIDE HCL 5 MG PO TABS: 5.0000 mg | ORAL_TABLET | Freq: Three times a day (TID) | ORAL | Status: DC | PRN

## 2019-09-02 MED ORDER — ONDANSETRON HCL 4 MG PO TABS: 4.0000 mg | ORAL_TABLET | Freq: Four times a day (QID) | ORAL | Status: DC | PRN

## 2019-09-02 MED ORDER — ROPIVACAINE HCL 7.5 MG/ML IJ SOLN
INTRAMUSCULAR | Status: DC | PRN
  Administered 2019-09-02: 20 mL via PERINEURAL

## 2019-09-02 MED ORDER — PHENOL 1.4 % MT LIQD: 1.0000 | OROMUCOSAL | Status: DC | PRN

## 2019-09-02 MED ORDER — FLUTICASONE FUROATE-VILANTEROL 200-25 MCG/INH IN AEPB
1.0000 | INHALATION_SPRAY | Freq: Every day | RESPIRATORY_TRACT | Status: DC
  Administered 2019-09-03 – 2019-09-04 (×2): 1 via RESPIRATORY_TRACT
  Filled 2019-09-02: qty 28

## 2019-09-02 MED ORDER — DEXAMETHASONE SODIUM PHOSPHATE 10 MG/ML IJ SOLN
INTRAMUSCULAR | Status: AC
  Filled 2019-09-02: qty 1

## 2019-09-02 MED ORDER — GABAPENTIN 300 MG PO CAPS: 300.0000 mg | ORAL_CAPSULE | Freq: Two times a day (BID) | ORAL | 0 refills | Status: AC

## 2019-09-02 MED ORDER — DILTIAZEM HCL ER COATED BEADS 180 MG PO CP24
180.0000 mg | ORAL_CAPSULE | Freq: Every day | ORAL | Status: DC
  Administered 2019-09-03 – 2019-09-04 (×2): 180 mg via ORAL
  Filled 2019-09-02 (×2): qty 1

## 2019-09-02 MED ORDER — CELECOXIB 200 MG PO CAPS
200.0000 mg | ORAL_CAPSULE | Freq: Two times a day (BID) | ORAL | Status: DC
  Administered 2019-09-02 – 2019-09-04 (×5): 200 mg via ORAL
  Filled 2019-09-02 (×5): qty 1

## 2019-09-02 MED ORDER — CELECOXIB 200 MG PO CAPS: 200.0000 mg | ORAL_CAPSULE | Freq: Two times a day (BID) | ORAL | 0 refills | Status: AC

## 2019-09-02 MED ORDER — SODIUM CHLORIDE 0.9% FLUSH
INTRAVENOUS | Status: DC | PRN
  Administered 2019-09-02: 30 mL

## 2019-09-02 MED ORDER — HYDROMORPHONE HCL 1 MG/ML IJ SOLN
0.5000 mg | INTRAMUSCULAR | Status: DC | PRN
  Administered 2019-09-02 – 2019-09-03 (×3): 1 mg via INTRAVENOUS
  Filled 2019-09-02 (×3): qty 1

## 2019-09-02 MED ORDER — POVIDONE-IODINE 10 % EX SWAB: 2.0000 "application " | Freq: Once | CUTANEOUS | Status: DC

## 2019-09-02 MED ORDER — DOCUSATE SODIUM 100 MG PO CAPS
100.0000 mg | ORAL_CAPSULE | Freq: Two times a day (BID) | ORAL | Status: DC
  Administered 2019-09-02 – 2019-09-04 (×4): 100 mg via ORAL
  Filled 2019-09-02 (×4): qty 1

## 2019-09-02 MED ORDER — BUPIVACAINE LIPOSOME 1.3 % IJ SUSP
INTRAMUSCULAR | Status: DC | PRN
  Administered 2019-09-02: 20 mL

## 2019-09-02 MED ORDER — DEXAMETHASONE SODIUM PHOSPHATE 10 MG/ML IJ SOLN
10.0000 mg | Freq: Once | INTRAMUSCULAR | Status: AC
  Administered 2019-09-03: 10 mg via INTRAVENOUS
  Filled 2019-09-02: qty 1

## 2019-09-02 MED ORDER — SORBITOL 70 % SOLN
30.0000 mL | Freq: Every day | Status: DC | PRN
  Filled 2019-09-02: qty 30

## 2019-09-02 MED ORDER — MIDAZOLAM HCL 2 MG/2ML IJ SOLN
1.0000 mg | INTRAMUSCULAR | Status: DC
  Administered 2019-09-02: 1 mg via INTRAVENOUS
  Filled 2019-09-02: qty 2

## 2019-09-02 MED ORDER — ACETAMINOPHEN 325 MG PO TABS: 325.0000 mg | ORAL_TABLET | Freq: Four times a day (QID) | ORAL | Status: DC | PRN

## 2019-09-02 MED ORDER — LACTATED RINGERS IV SOLN
INTRAVENOUS | Status: DC
  Administered 2019-09-02 (×3): via INTRAVENOUS

## 2019-09-02 MED ORDER — ONDANSETRON HCL 4 MG/2ML IJ SOLN: 4.0000 mg | Freq: Once | INTRAMUSCULAR | Status: DC | PRN

## 2019-09-02 MED ORDER — BUPIVACAINE IN DEXTROSE 0.75-8.25 % IT SOLN
INTRATHECAL | Status: DC | PRN
  Administered 2019-09-02: 1.8 mL via INTRATHECAL

## 2019-09-02 MED ORDER — ONDANSETRON HCL 4 MG/2ML IJ SOLN
4.0000 mg | Freq: Four times a day (QID) | INTRAMUSCULAR | Status: DC | PRN
  Administered 2019-09-03: 4 mg via INTRAVENOUS
  Filled 2019-09-02: qty 2

## 2019-09-02 MED ORDER — OXYCODONE HCL 5 MG/5ML PO SOLN: 5.0000 mg | Freq: Once | ORAL | Status: DC | PRN

## 2019-09-02 MED ORDER — POLYETHYLENE GLYCOL 3350 17 G PO PACK: 17.0000 g | PACK | Freq: Every day | ORAL | Status: DC | PRN

## 2019-09-02 MED ORDER — ACETAMINOPHEN 500 MG PO TABS
1000.0000 mg | ORAL_TABLET | Freq: Once | ORAL | Status: DC
  Filled 2019-09-02: qty 2

## 2019-09-02 MED ORDER — ONDANSETRON HCL 4 MG/2ML IJ SOLN
INTRAMUSCULAR | Status: DC | PRN
  Administered 2019-09-02: 4 mg via INTRAVENOUS

## 2019-09-02 MED ORDER — ASPIRIN 81 MG PO CHEW
81.0000 mg | CHEWABLE_TABLET | Freq: Two times a day (BID) | ORAL | Status: DC
  Administered 2019-09-02 – 2019-09-04 (×4): 81 mg via ORAL
  Filled 2019-09-02 (×4): qty 1

## 2019-09-02 MED ORDER — BACLOFEN 10 MG PO TABS: 10.0000 mg | ORAL_TABLET | Freq: Three times a day (TID) | ORAL | 0 refills | Status: DC | PRN

## 2019-09-02 MED ORDER — ALBUTEROL SULFATE (2.5 MG/3ML) 0.083% IN NEBU: 2.5000 mg | INHALATION_SOLUTION | Freq: Four times a day (QID) | RESPIRATORY_TRACT | Status: DC | PRN

## 2019-09-02 MED ORDER — SODIUM CHLORIDE (PF) 0.9 % IJ SOLN
INTRAMUSCULAR | Status: AC
  Filled 2019-09-02: qty 50

## 2019-09-02 MED ORDER — METHOCARBAMOL 500 MG PO TABS
500.0000 mg | ORAL_TABLET | Freq: Four times a day (QID) | ORAL | Status: DC | PRN
  Administered 2019-09-02 – 2019-09-04 (×4): 500 mg via ORAL
  Filled 2019-09-02 (×4): qty 1

## 2019-09-02 MED ORDER — OXYCODONE HCL 5 MG PO TABS: 5.0000 mg | ORAL_TABLET | ORAL | 0 refills | Status: AC | PRN

## 2019-09-02 MED ORDER — ACETAMINOPHEN 500 MG PO TABS
1000.0000 mg | ORAL_TABLET | Freq: Three times a day (TID) | ORAL | Status: AC
  Administered 2019-09-02 – 2019-09-04 (×6): 1000 mg via ORAL
  Filled 2019-09-02 (×6): qty 2

## 2019-09-02 MED ORDER — MAGNESIUM GLUCONATE 500 MG PO TABS
500.0000 mg | ORAL_TABLET | Freq: Every day | ORAL | Status: DC
  Administered 2019-09-03 – 2019-09-04 (×2): 500 mg via ORAL
  Filled 2019-09-02 (×3): qty 1

## 2019-09-02 MED ORDER — MENTHOL 3 MG MT LOZG: 1.0000 | LOZENGE | OROMUCOSAL | Status: DC | PRN

## 2019-09-02 MED ORDER — LISINOPRIL 20 MG PO TABS
20.0000 mg | ORAL_TABLET | Freq: Every day | ORAL | Status: DC
  Administered 2019-09-03 – 2019-09-04 (×2): 20 mg via ORAL
  Filled 2019-09-02 (×2): qty 1

## 2019-09-02 MED ORDER — CHLORHEXIDINE GLUCONATE 4 % EX LIQD: 60.0000 mL | Freq: Once | CUTANEOUS | Status: DC

## 2019-09-02 MED ORDER — STERILE WATER FOR IRRIGATION IR SOLN
Status: DC | PRN
  Administered 2019-09-02: 2000 mL

## 2019-09-02 MED ORDER — FENTANYL CITRATE (PF) 100 MCG/2ML IJ SOLN: 25.0000 ug | INTRAMUSCULAR | Status: DC | PRN

## 2019-09-02 MED ORDER — MAGNESIUM CITRATE PO SOLN: 1.0000 | Freq: Once | ORAL | Status: DC | PRN

## 2019-09-02 MED ORDER — TRANEXAMIC ACID-NACL 1000-0.7 MG/100ML-% IV SOLN
1000.0000 mg | INTRAVENOUS | Status: AC
  Administered 2019-09-02: 1000 mg via INTRAVENOUS
  Filled 2019-09-02: qty 100

## 2019-09-02 MED ORDER — METHOCARBAMOL 500 MG IVPB - SIMPLE MED
500.0000 mg | Freq: Four times a day (QID) | INTRAVENOUS | Status: DC | PRN
  Filled 2019-09-02: qty 50

## 2019-09-02 MED ORDER — SPIRONOLACTONE 25 MG PO TABS
25.0000 mg | ORAL_TABLET | Freq: Every day | ORAL | Status: DC
  Administered 2019-09-03 – 2019-09-04 (×2): 25 mg via ORAL
  Filled 2019-09-02 (×3): qty 1

## 2019-09-02 MED ORDER — ONDANSETRON HCL 4 MG/2ML IJ SOLN
INTRAMUSCULAR | Status: AC
  Filled 2019-09-02: qty 2

## 2019-09-02 MED ORDER — OXYCODONE HCL 5 MG PO TABS
5.0000 mg | ORAL_TABLET | ORAL | Status: DC | PRN
  Administered 2019-09-02 – 2019-09-03 (×3): 10 mg via ORAL
  Filled 2019-09-02 (×3): qty 2

## 2019-09-02 MED ORDER — GABAPENTIN 100 MG PO CAPS
100.0000 mg | ORAL_CAPSULE | Freq: Three times a day (TID) | ORAL | Status: DC
  Administered 2019-09-02 – 2019-09-04 (×5): 100 mg via ORAL
  Filled 2019-09-02 (×5): qty 1

## 2019-09-02 MED ORDER — LACTATED RINGERS IV SOLN
INTRAVENOUS | Status: DC
  Administered 2019-09-02 – 2019-09-03 (×2): via INTRAVENOUS

## 2019-09-02 MED ORDER — PROPOFOL 500 MG/50ML IV EMUL
INTRAVENOUS | Status: DC | PRN
  Administered 2019-09-02: 75 ug/kg/min via INTRAVENOUS

## 2019-09-02 SURGICAL SUPPLY — 49 items
BLADE HEX COATED 2.75 (ELECTRODE) ×3 IMPLANT
BLADE SAG 18X100X1.27 (BLADE) ×3 IMPLANT
BLADE SAGITTAL 25.0X1.37X90 (BLADE) ×2 IMPLANT
BLADE SAGITTAL 25.0X1.37X90MM (BLADE) ×1
BLADE SURG 15 STRL LF DISP TIS (BLADE) ×1 IMPLANT
BLADE SURG 15 STRL SS (BLADE) ×2
BLADE SURG SZ10 CARB STEEL (BLADE) ×6 IMPLANT
BNDG ELASTIC 6X10 VLCR STRL LF (GAUZE/BANDAGES/DRESSINGS) ×3 IMPLANT
BOWL SMART MIX CTS (DISPOSABLE) IMPLANT
CLOSURE STERI-STRIP 1/2X4 (GAUZE/BANDAGES/DRESSINGS) ×2
CLSR STERI-STRIP ANTIMIC 1/2X4 (GAUZE/BANDAGES/DRESSINGS) ×4 IMPLANT
COVER SURGICAL LIGHT HANDLE (MISCELLANEOUS) ×3 IMPLANT
COVER WAND RF STERILE (DRAPES) ×3 IMPLANT
CUFF TOURN SGL QUICK 34 (TOURNIQUET CUFF) ×2
CUFF TRNQT CYL 34X4.125X (TOURNIQUET CUFF) ×1 IMPLANT
DECANTER SPIKE VIAL GLASS SM (MISCELLANEOUS) ×3 IMPLANT
DRAPE U-SHAPE 47X51 STRL (DRAPES) ×3 IMPLANT
DRSG MEPILEX BORDER 4X12 (GAUZE/BANDAGES/DRESSINGS) ×3 IMPLANT
DURAPREP 26ML APPLICATOR (WOUND CARE) ×6 IMPLANT
FEMORAL POSTERIOR SZ5 RT (Femur) ×1 IMPLANT
GLOVE BIO SURGEON STRL SZ7.5 (GLOVE) ×6 IMPLANT
GLOVE BIOGEL PI IND STRL 8 (GLOVE) ×2 IMPLANT
GLOVE BIOGEL PI INDICATOR 8 (GLOVE) ×4
GOWN STRL REUS W/ TWL LRG LVL3 (GOWN DISPOSABLE) ×2 IMPLANT
GOWN STRL REUS W/TWL LRG LVL3 (GOWN DISPOSABLE) ×4
HANDPIECE INTERPULSE COAX TIP (DISPOSABLE) ×2
HOLDER FOLEY CATH W/STRAP (MISCELLANEOUS) ×3 IMPLANT
IMMOBILIZER KNEE 22 UNIV (SOFTGOODS) ×3 IMPLANT
INSERT TIB BEARING SZ6 9 (Miscellaneous) ×3 IMPLANT
KIT TURNOVER KIT A (KITS) IMPLANT
KNEE PATELLA ASYMMETRIC 10X35 (Knees) ×3 IMPLANT
KNEE TIBIAL COMPONENT SZ6 (Knees) ×3 IMPLANT
MANIFOLD NEPTUNE II (INSTRUMENTS) ×3 IMPLANT
NS IRRIG 1000ML POUR BTL (IV SOLUTION) ×3 IMPLANT
PACK ICE MAXI GEL EZY WRAP (MISCELLANEOUS) ×3 IMPLANT
PACK TOTAL KNEE CUSTOM (KITS) ×3 IMPLANT
PIN FLUTED HEDLESS FIX 3.5X1/8 (PIN) ×3 IMPLANT
POSTERIOR FEMORAL SZ5 RT (Femur) ×3 IMPLANT
PROTECTOR NERVE ULNAR (MISCELLANEOUS) ×3 IMPLANT
SET HNDPC FAN SPRY TIP SCT (DISPOSABLE) ×1 IMPLANT
SUT MNCRL AB 4-0 PS2 18 (SUTURE) ×3 IMPLANT
SUT VIC AB 0 CT1 36 (SUTURE) ×6 IMPLANT
SUT VIC AB 1 CT1 36 (SUTURE) ×6 IMPLANT
SUT VIC AB 2-0 CT1 27 (SUTURE) ×2
SUT VIC AB 2-0 CT1 TAPERPNT 27 (SUTURE) ×1 IMPLANT
TRAY FOLEY MTR SLVR 14FR STAT (SET/KITS/TRAYS/PACK) IMPLANT
TRAY FOLEY MTR SLVR 16FR STAT (SET/KITS/TRAYS/PACK) ×3 IMPLANT
WRAP KNEE MAXI GEL POST OP (GAUZE/BANDAGES/DRESSINGS) ×3 IMPLANT
YANKAUER SUCT BULB TIP 10FT TU (MISCELLANEOUS) ×3 IMPLANT

## 2019-09-02 NOTE — Progress Notes (Signed)
Assisted Dr. Brock with right, ultrasound guided, adductor canal block. Side rails up, monitors on throughout procedure. See vital signs in flow sheet. Tolerated Procedure well.  

## 2019-09-02 NOTE — Anesthesia Procedure Notes (Signed)
Spinal  Patient location during procedure: OR Start time: 09/02/2019 9:40 AM End time: 09/02/2019 9:44 AM Staffing Anesthesiologist: Lyn Hollingshead, MD Resident/CRNA: Montel Clock, CRNA Performed: resident/CRNA  Preanesthetic Checklist Completed: patient identified, site marked, surgical consent, pre-op evaluation, timeout performed, IV checked, risks and benefits discussed and monitors and equipment checked Spinal Block Patient position: sitting Prep: DuraPrep Patient monitoring: heart rate, cardiac monitor, continuous pulse ox and blood pressure Approach: midline Location: L3-4 Injection technique: single-shot Needle Needle type: Pencan  Needle gauge: 24 G Needle length: 10 cm Needle insertion depth: 7.5 cm Assessment Sensory level: T6

## 2019-09-02 NOTE — Discharge Instructions (Signed)

## 2019-09-02 NOTE — Op Note (Signed)
DATE OF SURGERY:  09/02/2019 TIME: 11:00 AM  PATIENT NAME:  Thomas Wells   AGE: 68 y.o.    PRE-OPERATIVE DIAGNOSIS:  OSTEOARTHRITIS RIGHT KNEE  POST-OPERATIVE DIAGNOSIS:  Same  PROCEDURE:  Procedure(s): TOTAL KNEE ARTHROPLASTY   SURGEON:  Renette Butters, MD   ASSISTANT:  Roxan Hockey, PA-C, he was present and scrubbed throughout the case, critical for completion in a timely fashion, and for retraction, instrumentation, and closure.    OPERATIVE IMPLANTS: [employer] Triathlon Posterior Stabilized. Press fit knee  Femur size 5, Tibia size 6, Patella size 35 3-peg oval button, with a 9 mm polyethylene insert.   PREOPERATIVE INDICATIONS:  Thomas Wells is a 68 y.o. year old male with end stage bone on bone degenerative arthritis of the knee who failed conservative treatment, including injections, antiinflammatories, activity modification, and assistive devices, and had significant impairment of their activities of daily living, and elected for Total Knee Arthroplasty.   The risks, benefits, and alternatives were discussed at length including but not limited to the risks of infection, bleeding, nerve injury, stiffness, blood clots, the need for revision surgery, cardiopulmonary complications, among others, and they were willing to proceed.   OPERATIVE DESCRIPTION:  The patient was brought to the operative room and placed in a supine position.  General anesthesia was administered.  IV antibiotics were given.  The lower extremity was prepped and draped in the usual sterile fashion.  Time out was performed.  The leg was elevated and exsanguinated and the tourniquet was inflated.  Anterior approach was performed.  The patella was everted and osteophytes were removed.  The anterior horn of the medial and lateral meniscus was removed.   The distal femur was opened with the drill and the intramedullary distal femoral cutting jig was utilized, set at 5 degrees resecting 10 mm off the  distal femur.  Care was taken to protect the collateral ligaments.  The distal femoral sizing jig was applied, taking care to avoid notching.  Then the 4-in-1 cutting jig was applied and the anterior and posterior femur was cut, along with the chamfer cuts.  All posterior osteophytes were removed.  The flexion gap was then measured and was symmetric with the extension gap.  Then the extramedullary tibial cutting jig was utilized making the appropriate cut using the anterior tibial crest as a reference building in appropriate posterior slope.  Care was taken during the cut to protect the medial and collateral ligaments.  The proximal tibia was removed along with the posterior horns of the menisci.  The PCL was sacrificed.    The extensor gap was measured and was approximately 79mm.    I completed the distal femoral preparation using the appropriate jig to prepare the box.  The patella was then measured, and cut with the saw.    The proximal tibia sized and prepared accordingly with the reamer and the punch, and then all components were trialed with the above sized poly insert.  The knee was found to have excellent balance and full motion.    The above named components were then impacted into place and Poly tibial piece and patella were inserted.  I was very happy with his stability and ROM  I performed a periarticular injection with marcaine and toradol  The knee was easily taken through a range of motion and the patella tracked well and the knee irrigated copiously and the parapatellar and subcutaneous tissue closed with vicryl, and monocryl with steri strips for the skin.  The incision was dressed with sterile gauze and the tourniquet released and the patient was awakened and returned to the PACU in stable and satisfactory condition.  There were no complications.  Total tourniquet time was roughly 65 minutes.   POSTOPERATIVE PLAN: post op Abx, DVT px: SCD's, TED's, Early ambulation and  chemical px

## 2019-09-02 NOTE — Anesthesia Procedure Notes (Addendum)
Anesthesia Regional Block: Adductor canal block   Pre-Anesthetic Checklist: ,, timeout performed, Correct Patient, Correct Site, Correct Laterality, Correct Procedure, Correct Position, site marked, Risks and benefits discussed,  Surgical consent,  Pre-op evaluation,  At surgeon's request and post-op pain management  Laterality: Right  Prep: chloraprep       Needles:  Injection technique: Single-shot  Needle Type: Echogenic Needle     Needle Length: 9cm  Needle Gauge: 21     Additional Needles:   Narrative:  Start time: 09/02/2019 8:56 AM End time: 09/02/2019 9:00 AM Injection made incrementally with aspirations every 5 mL.  Performed by: Personally  Anesthesiologist: Audry Pili, MD  Additional Notes: No pain on injection. No increased resistance to injection. Injection made in 5cc increments. Good needle visualization. Patient tolerated the procedure well.

## 2019-09-02 NOTE — Transfer of Care (Signed)
Immediate Anesthesia Transfer of Care Note  Patient: PHILIPPE POSTON  Procedure(s) Performed: TOTAL KNEE ARTHROPLASTY (Right Knee)  Patient Location: PACU  Anesthesia Type:Spinal  Level of Consciousness: awake, alert  and oriented  Airway & Oxygen Therapy: Patient Spontanous Breathing and Patient connected to face mask oxygen  Post-op Assessment: Report given to RN and Post -op Vital signs reviewed and stable  Post vital signs: Reviewed and stable  Last Vitals:  Vitals Value Taken Time  BP 113/58 09/02/19 1150  Temp    Pulse 59 09/02/19 1152  Resp 19 09/02/19 1152  SpO2 100 % 09/02/19 1152  Vitals shown include unvalidated device data.  Last Pain:  Vitals:   09/02/19 0906  TempSrc:   PainSc: 0-No pain         Complications: No apparent anesthesia complications

## 2019-09-02 NOTE — Evaluation (Signed)
Physical Therapy Evaluation Patient Details Name: Thomas Wells MRN: [id number] DOB: Apr 22, 1951 Today's Date: 09/02/2019   History of Present Illness  Patient is 68 y.o. male s/p Rt TKA on 09/02/19 with PMH significant for obesity, neuropathy, GERD, OA, DM, and COPD.    Clinical Impression  Thomas Wells is a 68 y.o. male POD 0 s/p Rt TKA. Patient reports independence with occasional use of SPC for unlevel ground for mobility at baseline. Patient is now limited by functional impairments (see PT problem list below) and requires min assist for transfers and gait with RW. Patient was able to ambulate ~60 feet with RW and assist for safe management of RW. Patient instructed in exercise to facilitate ROM and circulation. Patient will benefit from continued skilled PT interventions to address impairments and progress towards PLOF. Acute PT will follow to progress mobility and stair training in preparation for safe discharge home.    Follow Up Recommendations Follow surgeon's recommendation for DC plan and follow-up therapies    Equipment Recommendations  None recommended by PT    Recommendations for Other Services       Precautions / Restrictions Precautions Precautions: Fall Restrictions Weight Bearing Restrictions: No      Mobility  Bed Mobility Overal bed mobility: Needs Assistance Bed Mobility: Supine to Sit     Supine to sit: HOB elevated;Min assist     General bed mobility comments: cues for use of bed rails and assist to bring Rt LE around to EOB  Transfers Overall transfer level: Needs assistance Equipment used: Rolling walker (2 wheeled) Transfers: Sit to/from Stand Sit to Stand: Min assist;Mod assist         General transfer comment: cues for safe hand placement and technique with RW to initiate power up. Assist requried to perform power up and complete rise from low surface  Ambulation/Gait Ambulation/Gait assistance: Min assist;Min guard Gait Distance  (Feet): 60 Feet Assistive device: Rolling walker (2 wheeled) Gait Pattern/deviations: Wide base of support;Decreased step length - left;Decreased stance time - right Gait velocity: decreased   General Gait Details: cues reqrueid for safe step length as pt tended to take large step with Rt LE byond front of walker. Assist required intermittently for safe RW positioning and ceus for step sequencing.  Stairs            Wheelchair Mobility    Modified Rankin (Stroke Patients Only)       Balance Overall balance assessment: Needs assistance Sitting-balance support: Feet supported;No upper extremity supported Sitting balance-Leahy Scale: Good     Standing balance support: During functional activity;Bilateral upper extremity supported Standing balance-Leahy Scale: Poor                Pertinent Vitals/Pain Pain Assessment: 0-10 Pain Score: 4  Pain Location: Rt knee Pain Descriptors / Indicators: Aching;Sore Pain Intervention(s): Limited activity within patient's tolerance;Monitored during session;Repositioned;Ice applied    Home Living Family/patient expects to be discharged to:: Private residence Living Arrangements: Spouse/significant other Available Help at Discharge: Family;Available 24 hours/day Type of Home: House Home Access: Stairs to enter Entrance Stairs-Rails: Right;Left;Can reach both Entrance Stairs-Number of Steps: 3 steps with rails Home Layout: One level Home Equipment: Walker - 2 wheels;Bedside commode;Cane - single point;Toilet riser;Grab bars - tub/shower;Hand held shower head      Prior Function Level of Independence: Independent;Independent with assistive device(s)         Comments: pt reports occasional use of SPC for ambulation, mostly on uneven ground as he would  walk on field 2-2.5 miles daily for exercise.     Hand Dominance   Dominant Hand: Right    Extremity/Trunk Assessment   Upper Extremity Assessment Upper Extremity  Assessment: Overall WFL for tasks assessed    Lower Extremity Assessment Lower Extremity Assessment: RLE deficits/detail RLE Deficits / Details: pt with good supine quad activation for quad set, extensor lag noted with SLR and immobilizer doned for gait RLE: Unable to fully assess due to immobilization RLE Sensation: WNL RLE Coordination: WNL    Cervical / Trunk Assessment Cervical / Trunk Assessment: Normal  Communication   Communication: No difficulties  Cognition Arousal/Alertness: Awake/alert Behavior During Therapy: WFL for tasks assessed/performed Overall Cognitive Status: Within Functional Limits for tasks assessed           General Comments      Exercises Total Joint Exercises Ankle Circles/Pumps: AROM;Seated;15 reps;Both Quad Sets: AROM;10 reps;Seated;Right   Assessment/Plan    PT Assessment Patient needs continued PT services  PT Problem List Decreased strength;Decreased balance;Decreased mobility;Decreased range of motion;Decreased activity tolerance;Decreased knowledge of use of DME       PT Treatment Interventions DME instruction;Functional mobility training;Balance training;Patient/family education;Gait training;Therapeutic activities;Therapeutic exercise;Stair training;Modalities    PT Goals (Current goals can be found in the Care Plan section)  Acute Rehab PT Goals Patient Stated Goal: to get back to walking 2-2.5 miles for exercise PT Goal Formulation: With patient Time For Goal Achievement: 09/09/19 Potential to Achieve Goals: Good    Frequency 7X/week    AM-PAC PT "6 Clicks" Mobility  Outcome Measure Help needed turning from your back to your side while in a flat bed without using bedrails?: A Little Help needed moving from lying on your back to sitting on the side of a flat bed without using bedrails?: A Little Help needed moving to and from a bed to a chair (including a wheelchair)?: A Little Help needed standing up from a chair using your  arms (e.g., wheelchair or bedside chair)?: A Little Help needed to walk in hospital room?: A Little Help needed climbing 3-5 steps with a railing? : A Little 6 Click Score: 18    End of Session Equipment Utilized During Treatment: Gait belt;Right knee immobilizer(immobilizer due to reduced quad activation) Activity Tolerance: Patient tolerated treatment well Patient left: with call bell/phone within reach;in chair;with chair alarm set Nurse Communication: Mobility status PT Visit Diagnosis: Muscle weakness (generalized) (M62.81);Difficulty in walking, not elsewhere classified (R26.2)    Time: [id number] PT Time Calculation (min) (ACUTE ONLY): 28 min   Charges:   PT Evaluation $PT Eval Low Complexity: 1 Low PT Treatments $Gait Training: 8-22 mins        Kipp Brood, PT, DPT Physical Therapist with [facility]  09/02/2019 6:33 PM

## 2019-09-02 NOTE — Progress Notes (Signed)
Pt. declined CPAP for this evening, wants to remain on n/c as per home regimen, aware to notify if needed, remains on cont. pulse oximetry.

## 2019-09-02 NOTE — Interval H&P Note (Signed)
I participated in the care of this patient and agree with the above history, physical and evaluation. I performed a review of the history and a physical exam as detailed   Owin Vignola Daniel Lynzy Rawles MD  

## 2019-09-02 NOTE — Anesthesia Postprocedure Evaluation (Signed)
Anesthesia Post Note  Patient: Thomas Wells  Procedure(s) Performed: TOTAL KNEE ARTHROPLASTY (Right Knee)     Patient location during evaluation: PACU Anesthesia Type: Spinal Level of consciousness: awake Pain management: pain level controlled Vital Signs Assessment: post-procedure vital signs reviewed and stable Respiratory status: spontaneous breathing Cardiovascular status: stable Postop Assessment: no headache, no backache, spinal receding, patient able to bend at knees and no apparent nausea or vomiting Anesthetic complications: no    Last Vitals:  Vitals:   09/02/19 1200 09/02/19 1215  BP: 115/62 127/62  Pulse: (!) 58 (!) 52  Resp: 17 13  Temp:    SpO2: 100% 99%    Last Pain:  Vitals:   09/02/19 1215  TempSrc:   PainSc: 0-No pain   Pain Goal:    LLE Motor Response: Purposeful movement (09/02/19 1215)   RLE Motor Response: Purposeful movement (09/02/19 1215)   L Sensory Level: L5-Outer lower leg, top of foot, great toe (09/02/19 1215) R Sensory Level: L5-Outer lower leg, top of foot, great toe (09/02/19 1215) Epidural/Spinal Function Patient able to flex knees: Yes (09/02/19 1215), Patient able to lift hips off bed: No (09/02/19 1215), Back pain beyond tenderness at insertion site: No (09/02/19 1215), Progressively worsening motor and/or sensory loss: No (09/02/19 1215), Bowel and/or bladder incontinence post epidural: No (09/02/19 1215)  Huston Foley

## 2019-09-03 MED ORDER — TRAMADOL HCL 50 MG PO TABS: 100.0000 mg | ORAL_TABLET | Freq: Four times a day (QID) | ORAL | Status: DC | PRN

## 2019-09-03 MED ORDER — TAMSULOSIN HCL 0.4 MG PO CAPS
0.4000 mg | ORAL_CAPSULE | Freq: Every day | ORAL | Status: DC
  Administered 2019-09-03 – 2019-09-04 (×2): 0.4 mg via ORAL
  Filled 2019-09-03 (×2): qty 1

## 2019-09-03 MED ORDER — TRAMADOL HCL 50 MG PO TABS
50.0000 mg | ORAL_TABLET | Freq: Four times a day (QID) | ORAL | Status: DC | PRN
  Administered 2019-09-03 – 2019-09-04 (×2): 50 mg via ORAL
  Filled 2019-09-03 (×2): qty 1

## 2019-09-03 NOTE — Discharge Summary (Addendum)
Discharge Summary  Patient ID: Thomas Wells MRN: [id number] DOB/AGE: 1951-02-12 68 y.o.  Admit date: 09/02/2019 Discharge date: 09/03/2019  Admission Diagnoses:  Primary osteoarthritis of right knee  Discharge Diagnoses:  Principal Problem:   Primary osteoarthritis of right knee Active Problems:   COPD, moderate (HCC)   Obstructive sleep apnea   DM2 (diabetes mellitus, type 2) (HCC)   HTN (hypertension)   Edema   GERD (gastroesophageal reflux disease)   Primary localized osteoarthritis of knee   History of total left knee replacement   Past Medical History:  Diagnosis Date  . Arthritis    Knees, fingers  . Chronic hepatitis C ([place])    1990s had treatment  . COPD (chronic obstructive pulmonary disease) ([place])    emphysema ([place]) [facility]  . DM2 (diabetes mellitus, type 2) ([place])    Dr. Jeralene Huff  . GERD (gastroesophageal reflux disease)   . Insomnia   . Lipoma   . 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 Obesity   . Pneumonia   . PONV (postoperative nausea and vomiting)    post op after dilaudid shot   . Pure hypercholesterolemia   . Sleep apnea    cpap  . Vitamin D deficiency     Surgeries: Procedure(s): TOTAL KNEE ARTHROPLASTY on 09/02/2019   Consultants (if any):   Discharged Condition: Improved  Hospital Course: Thomas Wells is an 68 y.o. male who was admitted 09/02/2019 with a diagnosis of Primary osteoarthritis of right knee and went to the operating room on 09/02/2019 and underwent the above named procedures.    He was given perioperative antibiotics:  Anti-infectives (From admission, onward)   Start     Dose/Rate Route Frequency Ordered Stop   09/02/19 1600  ceFAZolin (ANCEF) IVPB 1 g/50 mL premix     1 g 100 mL/hr over 30 Minutes Intravenous Every 6 hours 09/02/19 1300 09/02/19 2256   09/02/19 0600  ceFAZolin (ANCEF) 3 g in dextrose 5 % 50 mL IVPB     3 g 100 mL/hr over 30 Minutes Intravenous On call to O.R.  09/01/19 [id number] 09/02/19 0955    .  He was given sequential compression devices, early ambulation, and aspirin for DVT prophylaxis.  He benefited maximally from the hospital stay and there were no complications.  He remained inpatient until postop day 2 for continued mobilization with therapy and ensure spontaneous urination.  After Foley catheter was removed, he had some urinary retention through the day postop day 1 (09/03/2019) which resolved in the evening 09/03/2019.  Recent vital signs:  Vitals:   09/03/19 0107 09/03/19 0412  BP: 128/70 (!) 142/66  Pulse: 63 (!) 58  Resp: 16 18  Temp: 98.2 F (36.8 C) (!) 97.4 F (36.3 C)  SpO2: 98% 97%    Recent laboratory studies:  Lab Results  Component Value Date   HGB 13.9 08/26/2019   HGB 12.8 (L) 04/04/2019   HGB 13.0 10/16/2014   Lab Results  Component Value Date   WBC 8.3 08/26/2019   PLT 200 08/26/2019   Lab Results  Component Value Date   INR 1.15 10/15/2014   Lab Results  Component Value Date   NA 139 08/26/2019   K 4.2 08/26/2019   CL 106 08/26/2019   CO2 23 08/26/2019   BUN 20 08/26/2019   CREATININE 0.94 08/26/2019   GLUCOSE 136 (H) 08/26/2019    Discharge Medications:   Allergies as of 09/03/2019  Reactions   Hydrocodone Nausea And Vomiting   Statins Nausea Only   Muscle pain, confusion       Medication List    STOP taking these medications   ibuprofen 200 MG tablet Commonly known as: ADVIL     TAKE these medications   acetaminophen 500 MG tablet Commonly known as: TYLENOL Take 2 tablets (1,000 mg total) by mouth every 8 (eight) hours for 10 days. For Pain. What changed:   when to take this  reasons to take this  additional instructions   aspirin EC 81 MG tablet Take 1 tablet (81 mg total) by mouth 2 (two) times daily. For DVT prophylaxis for 30 days after surgery. What changed:   when to take this  additional instructions   B-12 2500 MCG Tabs Take 2,500 mcg by mouth daily.    baclofen 10 MG tablet Commonly known as: LIORESAL Take 1 tablet (10 mg total) by mouth 3 (three) times daily as needed for muscle spasms.   betamethasone dipropionate 0.05 % cream Apply 1 application topically 2 (two) times daily as needed (dry skin).   celecoxib 200 MG capsule Commonly known as: CeleBREX Take 1 capsule (200 mg total) by mouth 2 (two) times daily for 14 days. For 2 weeks post op for pain and inflammation.  Discontinue Ibuprofen or other Anti-inflammatory medicine when taking this medicine.   diltiazem 180 MG 24 hr capsule Commonly known as: CARDIZEM CD Take 180 mg by mouth daily.   Fluticasone-Salmeterol 250-50 MCG/DOSE Aepb Commonly known as: ADVAIR Inhale 1 puff into the lungs daily.   gabapentin 300 MG capsule Commonly known as: Neurontin Take 1 capsule (300 mg total) by mouth 2 (two) times daily for 14 days. For 2 weeks post op for pain.   lisinopril 20 MG tablet Commonly known as: ZESTRIL Take 20 mg by mouth daily.   magnesium gluconate 500 MG tablet Commonly known as: MAGONATE Take 500 mg by mouth daily.   omeprazole 40 MG capsule Commonly known as: PRILOSEC Take 40 mg by mouth daily.   ondansetron 4 MG tablet Commonly known as: Zofran Take 1 tablet (4 mg total) by mouth every 8 (eight) hours as needed for nausea or vomiting.   oxyCODONE 5 MG immediate release tablet Commonly known as: Roxicodone Take 1 tablet (5 mg total) by mouth every 4 (four) hours as needed for breakthrough pain.   spironolactone 25 MG tablet Commonly known as: ALDACTONE Take 25 mg by mouth daily.   albuterol (2.5 MG/3ML) 0.083% nebulizer solution Commonly known as: PROVENTIL Take 2.5 mg by nebulization every 6 (six) hours as needed for wheezing or shortness of breath.   Ventolin HFA 108 (90 Base) MCG/ACT inhaler Generic drug: albuterol Inhale 2 puffs into the lungs every 6 (six) hours as needed for wheezing or shortness of breath.   Vitamin D3 25 MCG (1000 UT)  Caps Take 1,000 Units by mouth daily.       Diagnostic Studies: Dg Knee Right Port  Result Date: 09/02/2019 CLINICAL DATA:  68 year old male status post right knee arthroplasty. EXAM: PORTABLE RIGHT KNEE - 1-2 VIEW COMPARISON:  Knee radiograph dated 04/30/2019 FINDINGS: There is a total right knee arthroplasty. The arthroplasty components appear intact. No evidence of loosening. There is no acute fracture or dislocation. Degenerative changes and osteophyte adjacent to the fibular head. Small suprapatellar effusion as well as pockets of air anterior to the knee, postoperative. IMPRESSION: 1. No acute fracture or dislocation. 2. Status post total right knee arthroplasty.  No complication. Electronically Signed   By: Anner Crete M.D.   On: 09/02/2019 16:10    Disposition:     Follow-up Information    Renette Butters, MD.   Specialty: Orthopedic Surgery Contact information: [address] [place] [address] [phone]            Signed: Prudencio Burly III PA-C 09/03/2019, 7:22 AM

## 2019-09-03 NOTE — Progress Notes (Signed)
Physical Therapy Treatment Patient Details Name: Thomas Wells MRN: [id number] DOB: 12-31-1950 Today's Date: 09/03/2019    History of Present Illness Patient is 68 y.o. male s/p Rt TKA on 09/02/19 with PMH significant for obesity, neuropathy, GERD, OA, DM, and COPD.    PT Comments    Pt ambulated in hallway and performed LE exercises.  Pt states he has not yet been able to void and plans to stay overnight.  Pt would like to practice steps in the morning prior to d/c.   Follow Up Recommendations  Follow surgeon's recommendation for DC plan and follow-up therapies     Equipment Recommendations  None recommended by PT    Recommendations for Other Services       Precautions / Restrictions Precautions Precautions: Fall;Knee Restrictions Weight Bearing Restrictions: No    Mobility  Bed Mobility Overal bed mobility: Needs Assistance Bed Mobility: Supine to Sit;Sit to Supine     Supine to sit: Supervision;HOB elevated Sit to supine: Supervision;HOB elevated   General bed mobility comments: pt required a little assist for raising trunk with flat HOB  Transfers Overall transfer level: Needs assistance Equipment used: Rolling walker (2 wheeled) Transfers: Sit to/from Stand Sit to Stand: Min assist         General transfer comment: verbal cues for UE and LE positioning; assist to rise and steady as well as control descent  Ambulation/Gait Ambulation/Gait assistance: Min guard Gait Distance (Feet): 100 Feet Assistive device: Rolling walker (2 wheeled) Gait Pattern/deviations: Decreased stride length;Antalgic Gait velocity: decreased   General Gait Details: verbal cues for sequence, RW positioning, step length   Stairs             Wheelchair Mobility    Modified Rankin (Stroke Patients Only)       Balance                                            Cognition Arousal/Alertness: Awake/alert Behavior During Therapy: WFL for tasks  assessed/performed Overall Cognitive Status: Within Functional Limits for tasks assessed                                        Exercises Total Joint Exercises Ankle Circles/Pumps: AROM;Both;10 reps Quad Sets: AROM;Both;10 reps Heel Slides: AAROM;10 reps;Right Hip ABduction/ADduction: AAROM;Right;10 reps Straight Leg Raises: AAROM;Right;10 reps    General Comments        Pertinent Vitals/Pain Pain Assessment: 0-10 Pain Score: 5  Pain Location: Rt knee Pain Descriptors / Indicators: Aching;Sore Pain Intervention(s): Repositioned;Monitored during session;Ice applied    Home Living                      Prior Function            PT Goals (current goals can now be found in the care plan section) Progress towards PT goals: Progressing toward goals    Frequency    7X/week      PT Plan Current plan remains appropriate    Co-evaluation              AM-PAC PT "6 Clicks" Mobility   Outcome Measure  Help needed turning from your back to your side while in a flat bed without using bedrails?: A Little Help needed moving from  lying on your back to sitting on the side of a flat bed without using bedrails?: A Little Help needed moving to and from a bed to a chair (including a wheelchair)?: A Little Help needed standing up from a chair using your arms (e.g., wheelchair or bedside chair)?: A Little Help needed to walk in hospital room?: A Little Help needed climbing 3-5 steps with a railing? : A Little 6 Click Score: 18    End of Session Equipment Utilized During Treatment: Gait belt Activity Tolerance: Patient tolerated treatment well Patient left: in bed;with call bell/phone within reach;with bed alarm set Nurse Communication: Mobility status PT Visit Diagnosis: Muscle weakness (generalized) (M62.81);Difficulty in walking, not elsewhere classified (R26.2)     Time: [id number] PT Time Calculation (min) (ACUTE ONLY): 23 min  Charges:   $Gait Training: 8-22 mins $Therapeutic Exercise: 8-22 mins                    Carmelia Bake, PT, DPT Acute Rehabilitation Services Office: [phone] Pager: [phone]   Trena Platt 09/03/2019, 5:00 PM

## 2019-09-03 NOTE — Progress Notes (Signed)
    Subjective: Patient reports pain as moderate, controlled.  Tolerating diet.  No CP, SOB.  Good early mobilization with therapy yesterday.  Objective:   VITALS:   Vitals:   09/02/19 1718 09/02/19 2051 09/03/19 0107 09/03/19 0412  BP: (!) 141/71 (!) 149/69 128/70 (!) 142/66  Pulse: 81 71 63 (!) 58  Resp: 18 20 16 18   Temp: 98 F (36.7 C) 98.3 F (36.8 C) 98.2 F (36.8 C) (!) 97.4 F (36.3 C)  TempSrc:  Oral Oral Oral  SpO2: 94% 97% 98% 97%  Weight:      Height:       CBC Latest Ref Rng & Units 08/26/2019 04/04/2019 10/16/2014  WBC 4.0 - 10.5 K/uL 8.3 6.4 7.2  Hemoglobin 13.0 - 17.0 g/dL 13.9 12.8(L) 13.0  Hematocrit 39.0 - 52.0 % 41.8 39.7 40.3  Platelets 150 - 400 K/uL 200 184 155   BMP Latest Ref Rng & Units 08/26/2019 04/04/2019 10/16/2014  Glucose 70 - 99 mg/dL 136(H) 92 154(H)  BUN 8 - 23 mg/dL 20 24(H) 8  Creatinine 0.61 - 1.24 mg/dL 0.94 0.83 0.76  Sodium 135 - 145 mmol/L 139 138 140  Potassium 3.5 - 5.1 mmol/L 4.2 4.7 4.0  Chloride 98 - 111 mmol/L 106 107 106  CO2 22 - 32 mmol/L 23 26 30   Calcium 8.9 - 10.3 mg/dL 9.3 9.1 8.5   Intake/Output      11/10 0701 - 11/11 0700 11/11 0701 - 11/12 0700   P.O. 300    I.V. (mL/kg) 4211.2 (34.5)    IV Piggyback 50    Total Intake(mL/kg) 4561.2 (37.4)    Urine (mL/kg/hr) 2675    Stool 0    Blood 50    Total Output 2725    Net +1836.2         Stool Occurrence 0 x       Physical Exam: General: NAD.  Upright in bed.  Calm, conversant. Resp: No increased wob Cardio: regular rate and rhythm ABD soft Neurologically intact MSK RLE: Neurovascularly intact Sensation intact distally Feet warm Dorsiflexion/Plantar flexion intact Incision: dressing C/D/I   Assessment: 1 Day Post-Op  S/P Procedure(s) (LRB): TOTAL KNEE ARTHROPLASTY (Right) by Dr. Ernesta Amble. Percell Miller on 09/02/2019  Principal Problem:   Primary osteoarthritis of right knee Active Problems:   COPD, moderate (HCC)   Obstructive sleep apnea   DM2  (diabetes mellitus, type 2) (HCC)   HTN (hypertension)   Edema   GERD (gastroesophageal reflux disease)   Primary localized osteoarthritis of knee   History of total left knee replacement   Primary osteoarthritis, status post right total knee arthroplasty Well postop day 1 Tolerating diet and voiding Pain controlled Good early mobilization  Plan: Up with therapy Incentive Spirometry Elevate and Apply ice CPM, bone foam  Weight Bearing: Weight Bearing as Tolerated (WBAT) RLE Dressings: Maintain Mepilex.   VTE prophylaxis: Aspirin, SCDs, ambulation  Dispo: Home today after therapy evaluations  Anticipated LOSless than 2 midnights. Insurance approval for inpatient due to: - Age 69 and older with one or more of the following: - Obesity - Expected need for hospital services (PT, OT, Nursing) required for safe discharge - Active co-morbidities: Diabetes   Prudencio Burly III, PA-C 09/03/2019, 7:18 AM

## 2019-09-03 NOTE — Progress Notes (Signed)
Physical Therapy Treatment Patient Details Name: Thomas Wells MRN: [id number] DOB: Dec 17, 1950 Today's Date: 09/03/2019    History of Present Illness Patient is 68 y.o. male s/p Rt TKA on 09/02/19 with PMH significant for obesity, neuropathy, GERD, OA, DM, and COPD.    PT Comments    Pt ambulated in hallway and then assisted to/from bathroom. Pt reports headache today limiting mobility and felt fatigued after ambulating.  Will check back for afternoon session.    Follow Up Recommendations  Follow surgeon's recommendation for DC plan and follow-up therapies     Equipment Recommendations  None recommended by PT    Recommendations for Other Services       Precautions / Restrictions Precautions Precautions: Fall;Knee Restrictions Weight Bearing Restrictions: No    Mobility  Bed Mobility Overal bed mobility: Needs Assistance Bed Mobility: Supine to Sit     Supine to sit: Min assist     General bed mobility comments: pt required a little assist for raising trunk with flat HOB  Transfers Overall transfer level: Needs assistance Equipment used: Rolling walker (2 wheeled) Transfers: Sit to/from Stand Sit to Stand: Min assist         General transfer comment: verbal cues for UE and LE positioning; assist to rise and steady as well as control descent  Ambulation/Gait Ambulation/Gait assistance: Min assist;Min guard Gait Distance (Feet): 100 Feet Assistive device: Rolling walker (2 wheeled) Gait Pattern/deviations: Decreased stride length;Antalgic Gait velocity: decreased   General Gait Details: verbal cues for sequence, RW positioning, step length   Stairs             Wheelchair Mobility    Modified Rankin (Stroke Patients Only)       Balance                                            Cognition Arousal/Alertness: Awake/alert Behavior During Therapy: WFL for tasks assessed/performed Overall Cognitive Status: Within Functional  Limits for tasks assessed                                        Exercises      General Comments        Pertinent Vitals/Pain Pain Assessment: 0-10 Pain Score: 5  Pain Location: Rt knee Pain Descriptors / Indicators: Aching;Sore Pain Intervention(s): Repositioned;Monitored during session;Premedicated before session;Ice applied    Home Living                      Prior Function            PT Goals (current goals can now be found in the care plan section) Progress towards PT goals: Progressing toward goals    Frequency    7X/week      PT Plan Current plan remains appropriate    Co-evaluation              AM-PAC PT "6 Clicks" Mobility   Outcome Measure  Help needed turning from your back to your side while in a flat bed without using bedrails?: A Little Help needed moving from lying on your back to sitting on the side of a flat bed without using bedrails?: A Little Help needed moving to and from a bed to a chair (including a wheelchair)?: A  Little Help needed standing up from a chair using your arms (e.g., wheelchair or bedside chair)?: A Little Help needed to walk in hospital room?: A Little Help needed climbing 3-5 steps with a railing? : A Little 6 Click Score: 18    End of Session Equipment Utilized During Treatment: Gait belt Activity Tolerance: Patient tolerated treatment well Patient left: with call bell/phone within reach;in chair;with chair alarm set Nurse Communication: Mobility status PT Visit Diagnosis: Muscle weakness (generalized) (M62.81);Difficulty in walking, not elsewhere classified (R26.2)     Time: [id number] PT Time Calculation (min) (ACUTE ONLY): 27 min  Charges:  $Gait Training: 23-37 mins                     Carmelia Bake, PT, DPT Acute Rehabilitation Services Office: [phone] Pager: [phone]   Trena Platt 09/03/2019, 1:22 PM

## 2019-09-03 NOTE — Progress Notes (Addendum)
Therapy Plan: Prearranged [facility] PT in Orthopedic office.   Has DME

## 2019-09-03 NOTE — Plan of Care (Signed)
  Problem: Education: Goal: Knowledge of the prescribed therapeutic regimen will improve Outcome: Progressing   Problem: Activity: Goal: Ability to avoid complications of mobility impairment will improve Outcome: Progressing Goal: Range of joint motion will improve Outcome: Progressing   Problem: Pain Management: Goal: Pain level will decrease with appropriate interventions Outcome: Progressing   Problem: Education: Goal: Knowledge of General Education information will improve Description: Including pain rating scale, medication(s)/side effects and non-pharmacologic comfort measures Outcome: Progressing   Problem: Clinical Measurements: Goal: Respiratory complications will improve Outcome: Progressing Goal: Cardiovascular complication will be avoided Outcome: Progressing   Problem: Activity: Goal: Risk for activity intolerance will decrease Outcome: Progressing   Problem: Nutrition: Goal: Adequate nutrition will be maintained Outcome: Progressing   Problem: Safety: Goal: Ability to remain free from injury will improve Outcome: Progressing

## 2019-09-03 NOTE — Progress Notes (Signed)
RN attempted to place pt in bone foam. Pt refused, RN educated the pt on purpose and benefit of the bone foam. Pt continues to refuse.

## 2019-09-04 ENCOUNTER — Encounter (HOSPITAL_COMMUNITY): Payer: Self-pay | Admitting: Orthopedic Surgery

## 2019-09-04 NOTE — Plan of Care (Signed)
Patient discharged home in stable condition 

## 2019-09-04 NOTE — Progress Notes (Signed)
Subjective: Patient reports pain as mild to moderate, improving, and controlled.  Tolerating diet.  No CP, SOB.  Has been spontaneously urinating since early evening 09/03/2019.  Feeling ready for discharge after a.m. therapy/stair work.  Objective:   VITALS:   Vitals:   09/03/19 1500 09/03/19 1922 09/03/19 2100 09/04/19 0547  BP:   (!) 159/60 140/66  Pulse:   70 84  Resp:   16 16  Temp:   97.9 F (36.6 C) 97.9 F (36.6 C)  TempSrc:   Oral Oral  SpO2: 96% 97% 96% 95%  Weight:      Height:       CBC Latest Ref Rng & Units 08/26/2019 04/04/2019 10/16/2014  WBC 4.0 - 10.5 K/uL 8.3 6.4 7.2  Hemoglobin 13.0 - 17.0 g/dL 13.9 12.8(L) 13.0  Hematocrit 39.0 - 52.0 % 41.8 39.7 40.3  Platelets 150 - 400 K/uL 200 184 155   BMP Latest Ref Rng & Units 08/26/2019 04/04/2019 10/16/2014  Glucose 70 - 99 mg/dL 136(H) 92 154(H)  BUN 8 - 23 mg/dL 20 24(H) 8  Creatinine 0.61 - 1.24 mg/dL 0.94 0.83 0.76  Sodium 135 - 145 mmol/L 139 138 140  Potassium 3.5 - 5.1 mmol/L 4.2 4.7 4.0  Chloride 98 - 111 mmol/L 106 107 106  CO2 22 - 32 mmol/L 23 26 30   Calcium 8.9 - 10.3 mg/dL 9.3 9.1 8.5   Intake/Output      11/11 0701 - 11/12 0700   P.O. 360   I.V. (mL/kg) 1235.2 (10.1)   Total Intake(mL/kg) 1595.2 (13.1)   Urine (mL/kg/hr) 2500 (0.9)   Emesis/NG output 0   Stool 0   Total Output 2500   Net -904.8       Urine Occurrence 0 x   Stool Occurrence 0 x   Emesis Occurrence 0 x      Physical Exam: General: NAD.  Upright in bed.  Calm, conversant.  Hand-held urinal nearly half full with light yellow urine at bedside. Resp: No increased wob Cardio: regular rate and rhythm ABD soft Neurologically intact MSK RLE: Neurovascularly intact Sensation intact distally Feet warm Dorsiflexion/Plantar flexion intact Incision: dressing C/D/I   Assessment: 2 Days Post-Op  S/P Procedure(s) (LRB): TOTAL KNEE ARTHROPLASTY (Right) by Dr. Ernesta Amble. Percell Miller on 09/02/2019  Principal Problem:  Primary osteoarthritis of right knee Active Problems:   COPD, moderate (HCC)   Obstructive sleep apnea   DM2 (diabetes mellitus, type 2) (HCC)   HTN (hypertension)   Edema   GERD (gastroesophageal reflux disease)   Primary localized osteoarthritis of knee   History of total left knee replacement   Primary osteoarthritis, status post right total knee arthroplasty Doing well postop day 2 Pain controlled Good mobilization  Urinary retention Foley catheter removed a.m. postop day 1. In and out catheter required in the afternoon - ~55ml. He was given 1 dose of tamsulosin and IV fluids were resumed.  Retention resolved and he has been spontaneously urinating since yesterday early evening.   Plan: Up with therapy Incentive Spirometry Elevate and Apply ice CPM, bone foam  Weight Bearing: Weight Bearing as Tolerated (WBAT) RLE Dressings: Maintain Mepilex.   VTE prophylaxis: Aspirin, SCDs, ambulation  Dispo: Home today after a.m. therapy   Anticipated LOS equal to or greater than 2 midnights due to - Age 68 and older with one or more of the following:  - Obesity  - Expected need for hospital services (PT, OT, Nursing) required for safe  discharge  OR  Postop day 1 urinary retention.   Prudencio Burly III, PA-C 09/04/2019, 6:57 AM

## 2019-09-04 NOTE — Progress Notes (Signed)
Physical Therapy Treatment Patient Details Name: Thomas Wells MRN: [id number] DOB: 1950/12/24 Today's Date: 09/04/2019    History of Present Illness Patient is 68 y.o. male s/p Rt TKA on 09/02/19 with PMH significant for obesity, neuropathy, GERD, OA, DM, and COPD.    PT Comments    Pt ambulated in hallway and practiced safe stair technique.  Pt states he recalls exercises from yesterday and does not need to perform prior to d/c.  Provided HEP and pt looked over handout and had no questions.  Pt eager for d/c home.    Follow Up Recommendations  Follow surgeon's recommendation for DC plan and follow-up therapies     Equipment Recommendations  None recommended by PT    Recommendations for Other Services       Precautions / Restrictions Precautions Precautions: Fall;Knee Restrictions Weight Bearing Restrictions: No    Mobility  Bed Mobility Overal bed mobility: Needs Assistance Bed Mobility: Supine to Sit;Sit to Supine     Supine to sit: Supervision;HOB elevated Sit to supine: Supervision;HOB elevated      Transfers Overall transfer level: Needs assistance Equipment used: Rolling walker (2 wheeled) Transfers: Sit to/from Stand Sit to Stand: Min guard         General transfer comment: verbal cues for UE and LE positioning  Ambulation/Gait Ambulation/Gait assistance: Min guard Gait Distance (Feet): 140 Feet Assistive device: Rolling walker (2 wheeled) Gait Pattern/deviations: Decreased stride length;Antalgic Gait velocity: decreased   General Gait Details: verbal cues for sequence, RW positioning, step length   Stairs Stairs: Yes Stairs assistance: Min guard Stair Management: Step to pattern;Forwards;Two rails Number of Stairs: 2 General stair comments: verbal cues for safety and sequence   Wheelchair Mobility    Modified Rankin (Stroke Patients Only)       Balance                                            Cognition  Arousal/Alertness: Awake/alert Behavior During Therapy: WFL for tasks assessed/performed Overall Cognitive Status: Within Functional Limits for tasks assessed                                        Exercises      General Comments        Pertinent Vitals/Pain Pain Assessment: 0-10 Pain Score: 4  Pain Location: Rt knee Pain Descriptors / Indicators: Aching;Sore Pain Intervention(s): Monitored during session;Repositioned    Home Living                      Prior Function            PT Goals (current goals can now be found in the care plan section) Progress towards PT goals: Progressing toward goals    Frequency    7X/week      PT Plan Current plan remains appropriate    Co-evaluation              AM-PAC PT "6 Clicks" Mobility   Outcome Measure  Help needed turning from your back to your side while in a flat bed without using bedrails?: A Little Help needed moving from lying on your back to sitting on the side of a flat bed without using bedrails?: A Little Help needed moving to and  from a bed to a chair (including a wheelchair)?: A Little Help needed standing up from a chair using your arms (e.g., wheelchair or bedside chair)?: A Little Help needed to walk in hospital room?: A Little Help needed climbing 3-5 steps with a railing? : A Little 6 Click Score: 18    End of Session Equipment Utilized During Treatment: Gait belt Activity Tolerance: Patient tolerated treatment well Patient left: with call bell/phone within reach;in chair Nurse Communication: Mobility status PT Visit Diagnosis: Muscle weakness (generalized) (M62.81);Difficulty in walking, not elsewhere classified (R26.2)     Time: [id number] PT Time Calculation (min) (ACUTE ONLY): 16 min  Charges:  $Gait Training: 8-22 mins                     Carmelia Bake, PT, DPT Acute Rehabilitation Services Office: [phone] Pager: [phone]  Trena Platt 09/04/2019, 3:49 PM

## 2020-02-10 ENCOUNTER — Emergency Department (HOSPITAL_COMMUNITY)
Admission: EM | Admit: 2020-02-10 | Discharge: 2020-02-10 | Disposition: A | Discharge status: Home / Self Care | Payer: Medicare Other | Attending: Emergency Medicine | Admitting: Emergency Medicine

## 2020-02-10 ENCOUNTER — Encounter (HOSPITAL_COMMUNITY): Payer: Self-pay

## 2020-02-10 ENCOUNTER — Other Ambulatory Visit: Payer: Self-pay

## 2020-02-10 ENCOUNTER — Emergency Department (HOSPITAL_COMMUNITY): Payer: Medicare Other

## 2020-02-10 DIAGNOSIS — Z87891 Personal history of nicotine dependence: Secondary | ICD-10-CM | POA: Insufficient documentation

## 2020-02-10 DIAGNOSIS — Z79899 Other long term (current) drug therapy: Secondary | ICD-10-CM | POA: Insufficient documentation

## 2020-02-10 DIAGNOSIS — I10 Essential (primary) hypertension: Secondary | ICD-10-CM | POA: Diagnosis not present

## 2020-02-10 DIAGNOSIS — X509XXA Other and unspecified overexertion or strenuous movements or postures, initial encounter: Secondary | ICD-10-CM | POA: Diagnosis not present

## 2020-02-10 DIAGNOSIS — S161XXA Strain of muscle, fascia and tendon at neck level, initial encounter: Secondary | ICD-10-CM

## 2020-02-10 DIAGNOSIS — Y939 Activity, unspecified: Secondary | ICD-10-CM | POA: Insufficient documentation

## 2020-02-10 DIAGNOSIS — J449 Chronic obstructive pulmonary disease, unspecified: Secondary | ICD-10-CM | POA: Diagnosis not present

## 2020-02-10 DIAGNOSIS — Y929 Unspecified place or not applicable: Secondary | ICD-10-CM | POA: Insufficient documentation

## 2020-02-10 DIAGNOSIS — Y999 Unspecified external cause status: Secondary | ICD-10-CM | POA: Diagnosis not present

## 2020-02-10 DIAGNOSIS — E119 Type 2 diabetes mellitus without complications: Secondary | ICD-10-CM | POA: Insufficient documentation

## 2020-02-10 DIAGNOSIS — Z7982 Long term (current) use of aspirin: Secondary | ICD-10-CM | POA: Insufficient documentation

## 2020-02-10 DIAGNOSIS — S199XXA Unspecified injury of neck, initial encounter: Secondary | ICD-10-CM | POA: Diagnosis present

## 2020-02-10 HISTORY — DX: Unspecified viral hepatitis C without hepatic coma: B19.20

## 2020-02-10 MED ORDER — NAPROXEN 500 MG PO TABS: 500.0000 mg | ORAL_TABLET | Freq: Two times a day (BID) | ORAL | 0 refills | Status: AC

## 2020-02-10 MED ORDER — LIDOCAINE 4 % EX GEL: 1.0000 "application " | Freq: Three times a day (TID) | CUTANEOUS | 1 refills | Status: AC

## 2020-02-10 MED ORDER — NAPROXEN 500 MG PO TABS
500.0000 mg | ORAL_TABLET | Freq: Once | ORAL | Status: AC
  Administered 2020-02-10: 10:00:00 500 mg via ORAL
  Filled 2020-02-10: qty 1

## 2020-02-10 MED ORDER — ACETAMINOPHEN 500 MG PO TABS: 1000.0000 mg | ORAL_TABLET | Freq: Three times a day (TID) | ORAL | 0 refills | Status: AC | PRN

## 2020-02-10 MED ORDER — ACETAMINOPHEN 325 MG PO TABS
650.0000 mg | ORAL_TABLET | Freq: Once | ORAL | Status: AC
  Administered 2020-02-10: 650 mg via ORAL
  Filled 2020-02-10: qty 2

## 2020-02-10 MED ORDER — TIZANIDINE HCL 4 MG PO CAPS: 4.0000 mg | ORAL_CAPSULE | Freq: Three times a day (TID) | ORAL | 0 refills | Status: AC | PRN

## 2020-02-10 MED ORDER — LIDOCAINE 4 % EX CREA
TOPICAL_CREAM | Freq: Once | CUTANEOUS | Status: AC
  Administered 2020-02-10: 1 via TOPICAL
  Filled 2020-02-10: qty 5
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
## 2020-02-10 NOTE — ED Triage Notes (Signed)
Patient c/o left posterior neck pain and states that he is unable to turn his head. Patient states pain was worse when he tried to lay down. Patient states he mowed 2 yards yesterday and the pain worsened after that. MAE.  Patient states he has had neck pain since January, but worse now.

## 2020-02-10 NOTE — ED Notes (Signed)
Pt transported to CT at this time.

## 2020-02-10 NOTE — ED Notes (Signed)
Awaiting LMX from pharmacy

## 2020-02-10 NOTE — Discharge Instructions (Addendum)
We signed the ER for neck pain. Work-up here reveals degenerative cervical spine disease.  We suspect that most of your pain is because of cervical strain, sprain, which is a ligamentous injury.  Take the medications prescribed.  Ice and heat application 3 times a day for 10 minutes will be helpful as well.  Massage and stretching exercises will also help.  Recommend seeing your PCP in 1 week.

## 2020-08-29 IMAGING — DX DG KNEE 1-2V PORT*R*
2 series · 2 of 2 positions shown · non-contrast
Comparison: Knee radiograph dated 04/30/2019

CLINICAL DATA: 68-year-old male status post right knee
arthroplasty.

EXAM:
PORTABLE RIGHT KNEE - 1-2 VIEW

[knee ap]
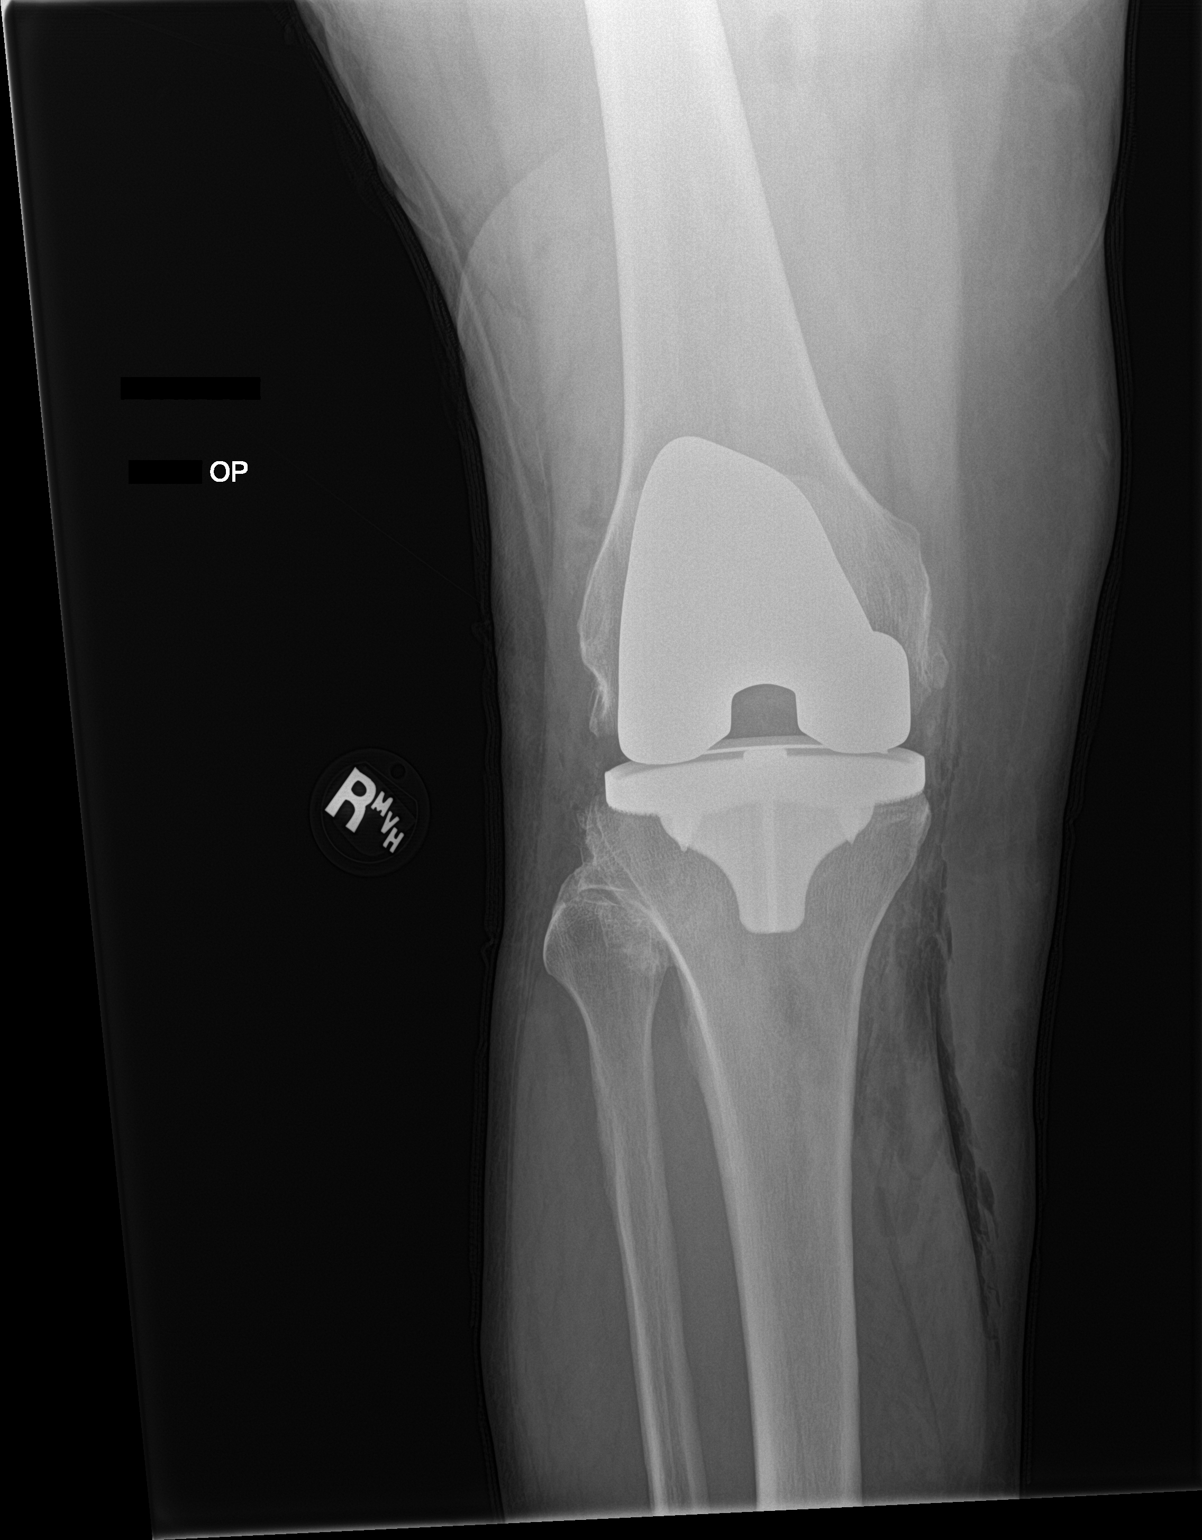

[knee lat]
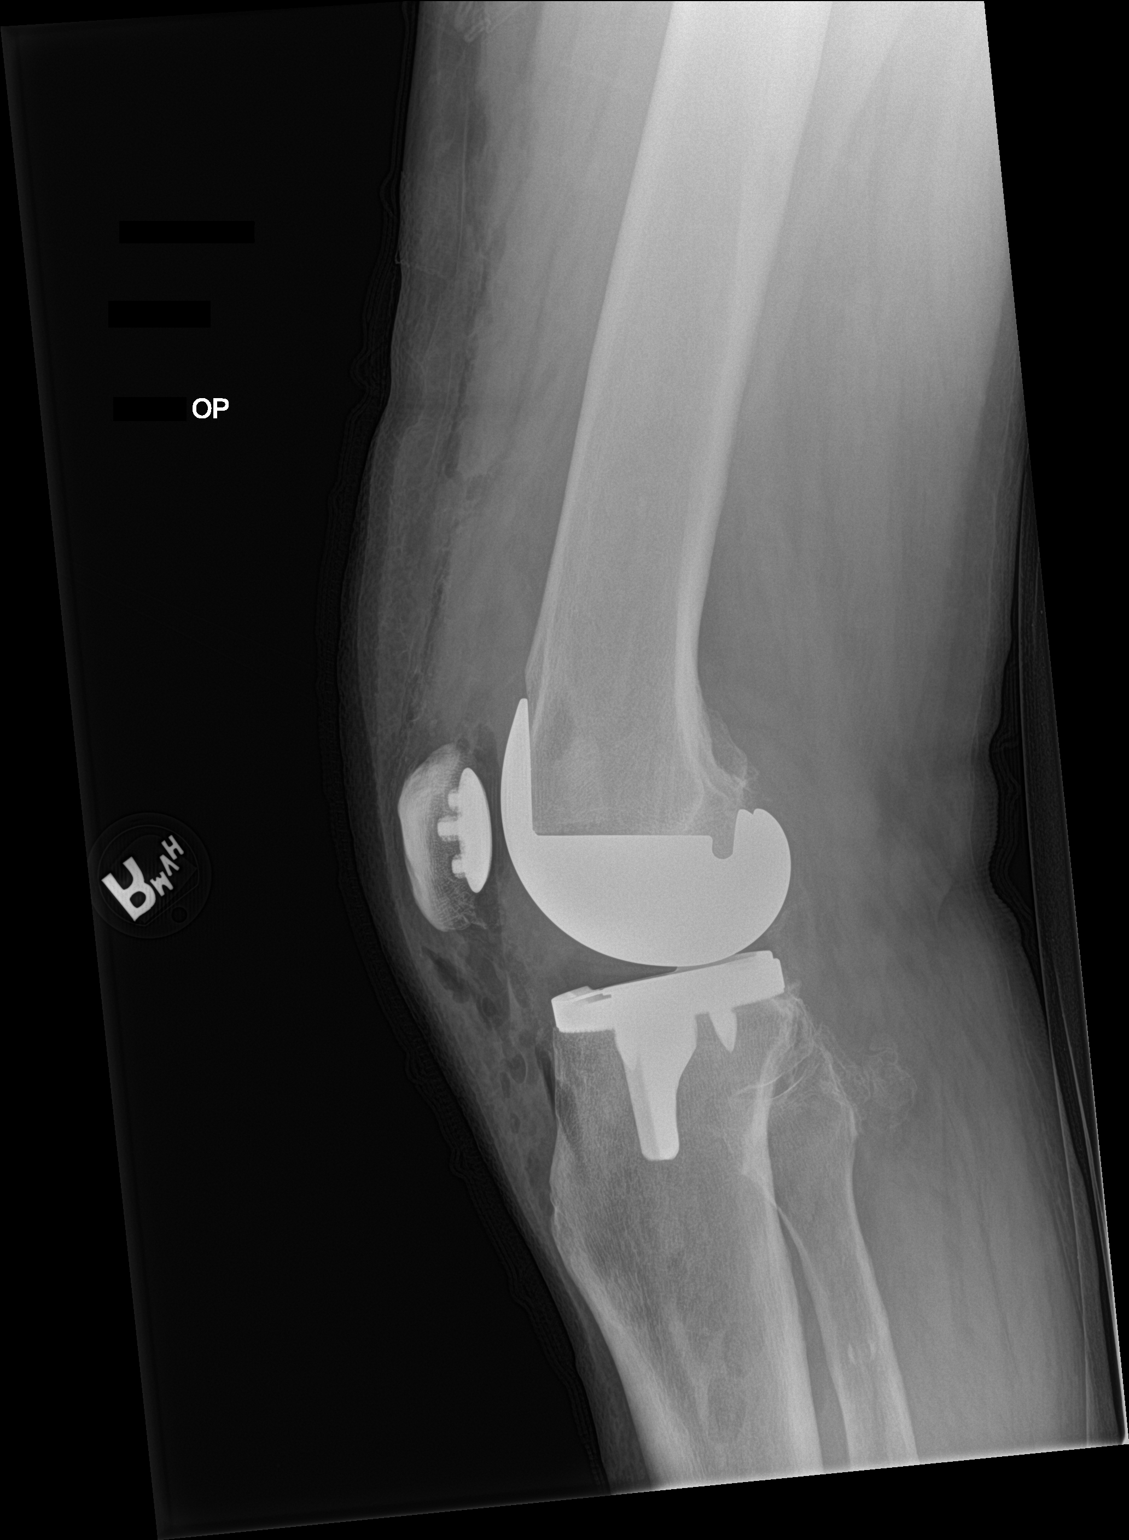

[2 of 2 positions shown; findings below may reference images not displayed]

FINDINGS: There is a total right knee arthroplasty. The arthroplasty
components appear intact. No evidence of loosening. There is no
acute fracture or dislocation. Degenerative changes and osteophyte
adjacent to the fibular head. Small suprapatellar effusion as well
as pockets of air anterior to the knee, postoperative.
IMPRESSION: 1. No acute fracture or dislocation.
2. Status post total right knee arthroplasty.  No complication.

## 2022-06-28 ENCOUNTER — Other Ambulatory Visit: Payer: Self-pay | Admitting: Neurosurgery

## 2022-06-28 DIAGNOSIS — M5416 Radiculopathy, lumbar region: Secondary | ICD-10-CM

## 2022-06-28 DIAGNOSIS — M5412 Radiculopathy, cervical region: Secondary | ICD-10-CM

## 2022-07-13 ENCOUNTER — Ambulatory Visit
Admission: RE | Admit: 2022-07-13 | Discharge: 2022-07-13 | Disposition: A | Discharge status: Home / Self Care | Payer: Medicare Other | Source: Ambulatory Visit | Attending: Neurosurgery | Admitting: Neurosurgery

## 2022-07-13 DIAGNOSIS — M5416 Radiculopathy, lumbar region: Secondary | ICD-10-CM

## 2022-07-13 DIAGNOSIS — M5412 Radiculopathy, cervical region: Secondary | ICD-10-CM

## 2023-08-28 ENCOUNTER — Encounter (HOSPITAL_COMMUNITY): Payer: Self-pay
